# Patient Record
Sex: Female | Born: 1985 | Race: White | Hispanic: No | Marital: Married | Smoking: Former smoker
Health system: Southern US, Community
[De-identification: ages and names within clinical notes are randomized; demographics above are authoritative.]

## PROBLEM LIST (undated history)

## (undated) DIAGNOSIS — R002 Palpitations: Secondary | ICD-10-CM

## (undated) DIAGNOSIS — F419 Anxiety disorder, unspecified: Secondary | ICD-10-CM

## (undated) DIAGNOSIS — K029 Dental caries, unspecified: Secondary | ICD-10-CM

## (undated) DIAGNOSIS — R0602 Shortness of breath: Secondary | ICD-10-CM

## (undated) DIAGNOSIS — F32A Depression, unspecified: Secondary | ICD-10-CM

## (undated) DIAGNOSIS — R9431 Abnormal electrocardiogram [ECG] [EKG]: Secondary | ICD-10-CM

## (undated) DIAGNOSIS — J45909 Unspecified asthma, uncomplicated: Secondary | ICD-10-CM

## (undated) HISTORY — DX: Shortness of breath: R06.02

## (undated) HISTORY — DX: Palpitations: R00.2

## (undated) HISTORY — DX: Abnormal electrocardiogram (ECG) (EKG): R94.31

## (undated) HISTORY — DX: Depression, unspecified: F32.A

---
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
## 2010-06-06 ENCOUNTER — Ambulatory Visit (HOSPITAL_COMMUNITY): Admission: RE | Admit: 2010-06-06 | Discharge: 2010-06-06 | Payer: Self-pay | Admitting: Obstetrics and Gynecology

## 2010-07-12 ENCOUNTER — Ambulatory Visit (HOSPITAL_COMMUNITY): Admission: RE | Admit: 2010-07-12 | Discharge: 2010-07-12 | Payer: Self-pay | Admitting: Obstetrics and Gynecology

## 2010-07-19 ENCOUNTER — Ambulatory Visit (HOSPITAL_COMMUNITY)
Admission: RE | Admit: 2010-07-19 | Discharge: 2010-07-19 | Payer: Self-pay | Source: Home / Self Care | Admitting: Obstetrics & Gynecology

## 2010-07-31 ENCOUNTER — Inpatient Hospital Stay (HOSPITAL_COMMUNITY): Admission: AD | Admit: 2010-07-31 | Discharge: 2010-08-03 | Payer: Self-pay | Admitting: Obstetrics and Gynecology

## 2010-09-21 ENCOUNTER — Encounter: Payer: Self-pay | Admitting: Obstetrics & Gynecology

## 2010-11-11 LAB — CBC
Hemoglobin: 10.5 g/dL — ABNORMAL LOW (ref 12.0–15.0)
MCH: 32.6 pg (ref 26.0–34.0)
Platelets: 177 10*3/uL (ref 150–400)
RBC: 3.22 MIL/uL — ABNORMAL LOW (ref 3.87–5.11)
WBC: 10.2 10*3/uL (ref 4.0–10.5)

## 2010-11-12 LAB — TYPE AND SCREEN
ABO/RH(D): A POS
Antibody Screen: NEGATIVE

## 2010-11-12 LAB — CBC
HCT: 37.7 % (ref 36.0–46.0)
Hemoglobin: 12.8 g/dL (ref 12.0–15.0)
MCV: 94.9 fL (ref 78.0–100.0)
RBC: 3.97 MIL/uL (ref 3.87–5.11)
WBC: 11.1 10*3/uL — ABNORMAL HIGH (ref 4.0–10.5)

## 2010-11-12 LAB — ABO/RH: ABO/RH(D): A POS
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
## 2011-01-17 NOTE — Consult Note (Signed)
NAME:  Sandra Gilbert, Sandra Gilbert NO.:  [id number]   MEDICAL RECORD NO.:  [id number]          PATIENT TYPE:  MAT   LOCATION:  [facility]                          FACILITY:  [facility]   PHYSICIAN:  Richardean Sale, M.D.   DATE OF BIRTH:  1985/11/29   DATE OF CONSULTATION:  DATE OF DISCHARGE:                                   CONSULTATION   PROGRESS NOTE:   SUBJECTIVE:  This is a 25 year old gravida 0, white female who presented  originally from [facility] complaining of contractions at around 36-  weeks gestation. I evaluated the patient here in maternity admissions. She  told me that her due date was April 24, 2005 which would make her 36 plus  weeks. Upon admission she was contracting irregularly about every 3 to 6  minutes. I checked her cervix. She was about 1 cm, 70% effaced, -1 station  and vertex. The patient states she was seen at [facility] within the  last 48 hours and her cervix was also dilated 1 cm. Given that she stated  she was 36 plus weeks gestation, she received intravenous hydration with  plans to not tocolyse or augment labor. After I initially met with the  patient we acquired her medical records from [facility] [place] her prenatal care. Based on  an ultrasound that was performed at 13 weeks, the patient's due date is  May 11, 2005 which makes her 34 weeks and 1 day. According to the  patient's prenatal records, her due date has been May 11, 2005 since  the beginning of this prenatal record which is dated at her 28th week. Given  this finding, I had a talk with the patient and she admits that her due date  is May 11, 2005 which places her at 34 weeks and 1 day gestation. The  patient is still contracting after being hydrated for an hour. Repeat  cervical exam per the nurse is 1 cm, 70%, -1 station, vertex and unchanged  from previous exam.   OBJECTIVE:  VITAL SIGNS:  She  is afebrile. Her vital signs are stable. Fetal  heart rate tracing is reactive. Tocometer tracing shows contractions every 3  to 6 minutes.  ABDOMEN:  Gravid, soft and nontender.  CERVIX:  Is still 1 cm, 70%, -1 station, vertex.   Urinalysis is pending.   ASSESSMENT:  25 year old gravida 1, para 0 white female at 13 weeks and 1  day gestation by a 13-week ultrasound, placing her due date at May 11, 2005, who is having premature contractions. No cervical change.   PLAN:  Given that the patient is 34 weeks I recommend tocolysis. I explained  to the patient that the reason for this is to avoid delivering a premature  infant that may have respiratory difficulty or possibly mental delay. The  patient refused Terbutaline as she stated, It will hurt my baby.  I  explained to the patient that as a physician I would not administer a  medication that I thought would harm her baby. The  patient still refused  Terbutaline. She said she has taken both the oral and the injection form and  does not like the way it made her feel. I offered Procardia 10 mg p.o. x1.  The patient initially refused the medication, I again explained to her that  the reason for administering this medication is to prevent premature birth  which could have significant complications for her baby. Patient was given  the option of signing out against medical advice but agreed to take the  medication. We will treat with Procardia 10 mg p.o. x1 and reassess in 1  hour. If patient continues to contract will obtain group B beta strep  cultures, start antibiotics and will consider administering magnesium  sulfate.       JW/MEDQ  D:  03/30/2005  T:  03/30/2005  Job:  [id number]

## 2012-09-01 DIAGNOSIS — F419 Anxiety disorder, unspecified: Secondary | ICD-10-CM

## 2012-09-01 HISTORY — DX: Anxiety disorder, unspecified: F41.9

## 2014-11-01 ENCOUNTER — Inpatient Hospital Stay (HOSPITAL_COMMUNITY): Payer: Medicaid Other

## 2014-11-01 ENCOUNTER — Encounter (HOSPITAL_COMMUNITY): Payer: Self-pay

## 2014-11-01 ENCOUNTER — Inpatient Hospital Stay (HOSPITAL_COMMUNITY)
Admission: AD | Admit: 2014-11-01 | Discharge: 2014-11-01 | Disposition: A | Discharge status: Home / Self Care | Payer: Medicaid Other | Source: Ambulatory Visit | Attending: Obstetrics & Gynecology | Admitting: Obstetrics & Gynecology

## 2014-11-01 DIAGNOSIS — F17200 Nicotine dependence, unspecified, uncomplicated: Secondary | ICD-10-CM | POA: Diagnosis not present

## 2014-11-01 DIAGNOSIS — R109 Unspecified abdominal pain: Secondary | ICD-10-CM | POA: Diagnosis present

## 2014-11-01 DIAGNOSIS — Z3A10 10 weeks gestation of pregnancy: Secondary | ICD-10-CM | POA: Diagnosis not present

## 2014-11-01 DIAGNOSIS — O021 Missed abortion: Secondary | ICD-10-CM | POA: Diagnosis not present

## 2014-11-01 HISTORY — DX: Unspecified asthma, uncomplicated: J45.909

## 2014-11-01 LAB — URINALYSIS, ROUTINE W REFLEX MICROSCOPIC
Bilirubin Urine: NEGATIVE
GLUCOSE, UA: NEGATIVE mg/dL
Hgb urine dipstick: NEGATIVE
Ketones, ur: 15 mg/dL — AB
LEUKOCYTES UA: NEGATIVE
Nitrite: NEGATIVE
PROTEIN: NEGATIVE mg/dL
Specific Gravity, Urine: 1.015 (ref 1.005–1.030)
UROBILINOGEN UA: 1 mg/dL (ref 0.0–1.0)
pH: 6 (ref 5.0–8.0)

## 2014-11-01 LAB — CBC
HEMATOCRIT: 33.7 % — AB (ref 36.0–46.0)
HEMOGLOBIN: 11.8 g/dL — AB (ref 12.0–15.0)
MCH: 31.1 pg (ref 26.0–34.0)
MCHC: 35 g/dL (ref 30.0–36.0)
MCV: 88.9 fL (ref 78.0–100.0)
PLATELETS: 212 10*3/uL (ref 150–400)
RBC: 3.79 MIL/uL — AB (ref 3.87–5.11)
RDW: 12.5 % (ref 11.5–15.5)
WBC: 7.7 10*3/uL (ref 4.0–10.5)

## 2014-11-01 LAB — HCG, QUANTITATIVE, PREGNANCY: hCG, Beta Chain, Quant, S: 137886 m[IU]/mL — ABNORMAL HIGH (ref <5)

## 2014-11-01 MED ORDER — HYDROMORPHONE HCL 1 MG/ML IJ SOLN
1.0000 mg | Freq: Once | INTRAMUSCULAR | Status: AC
  Administered 2014-11-01: 1 mg via INTRAMUSCULAR
  Filled 2014-11-01: qty 1

## 2014-11-01 MED ORDER — PROMETHAZINE HCL 25 MG/ML IJ SOLN
12.5000 mg | Freq: Once | INTRAMUSCULAR | Status: AC
  Administered 2014-11-01: 12.5 mg via INTRAMUSCULAR
  Filled 2014-11-01: qty 1

## 2014-11-01 MED ORDER — HYDROCODONE-ACETAMINOPHEN 5-325 MG PO TABS: 1.0000 | ORAL_TABLET | ORAL | Status: DC | PRN

## 2014-11-01 NOTE — Progress Notes (Signed)
Unable to doppler FHTs 

## 2014-11-01 NOTE — MAU Note (Signed)
Pt presents to MAU with complaints of pain in the right lower side of her abdomen since this morning. Denies any vaginal bleeding or discharge

## 2014-11-01 NOTE — MAU Provider Note (Signed)
History     CSN: [id number]  Arrival date and time: 11/01/14 1344   First Provider Initiated Contact with Patient 11/01/14 1433      Chief Complaint  Patient presents with  . Abdominal Pain   HPI  [name] is a 29 y.o. female [phone] at 6372w1d who presents with right sided abdominal pain. She went to the health department in January and got her proof of pregnancy; she did not have an US done. She says the pain started this morning and has worsened throughout the day. She denies vaginal bleeding.    OB History    Gravida Para Term Preterm AB TAB SAB Ectopic Multiple Living   4 3 3       3       Past Medical History  Diagnosis Date  . Asthma     Past Surgical History  Procedure Laterality Date  . Cesarean section      History reviewed. No pertinent family history.  History  Substance Use Topics  . Smoking status: Current Every Day Smoker  . Smokeless tobacco: Not on file  . Alcohol Use: Not on file    Allergies: No Known Allergies  Prescriptions prior to admission  Medication Sig Dispense Refill Last Dose  . albuterol (PROVENTIL HFA;VENTOLIN HFA) 108 (90 BASE) MCG/ACT inhaler Inhale 2 puffs into the lungs every 4 (four) hours as needed for wheezing or shortness of breath.   10/30/2014  . Prenatal Vit-Fe Fumarate-FA (PRENATAL MULTIVITAMIN) TABS tablet Take 1 tablet by mouth daily at 12 noon.   10/31/2014 at Unknown time   Results for orders placed or performed during the hospital encounter of 11/01/14 (from the past 48 hour(s))  hCG, quantitative, pregnancy     Status: Abnormal   Collection Time: 11/01/14  2:52 PM  Result Value Ref Range   hCG, Beta Chain, Quant, S [id number] (H) <5 mIU/mL    Comment:          GEST. AGE      CONC.  (mIU/mL)   <=1 WEEK        5 - 50     2 WEEKS       50 - 500     3 WEEKS       100 - 10,000     4 WEEKS     1,000 - 30,000     5 WEEKS     3,500 - 115,000   6-8 WEEKS     12,000 - 270,000    12 WEEKS     15,000 - 220,000         FEMALE AND NON-PREGNANT FEMALE:     LESS THAN 5 mIU/mL   CBC     Status: Abnormal   Collection Time: 11/01/14  2:52 PM  Result Value Ref Range   WBC 7.7 4.0 - 10.5 K/uL   RBC 3.79 (L) 3.87 - 5.11 MIL/uL   Hemoglobin 11.8 (L) 12.0 - 15.0 g/dL   HCT 56.233.7 (L) 13.036.0 - 86.546.0 %   MCV 88.9 78.0 - 100.0 fL   MCH 31.1 26.0 - 34.0 pg   MCHC 35.0 30.0 - 36.0 g/dL   RDW 78.412.5 69.611.5 - 29.515.5 %   Platelets 212 150 - 400 K/uL   [place] Ob Comp Less 14 Wks  11/01/2014   CLINICAL DATA:  Abdominal pain in early pregnancy. Gestational age by LMP of fourteen weeks 2 days.  EXAM: OBSTETRIC <14 WK ULTRASOUND  TECHNIQUE: Transabdominal ultrasound was performed for  evaluation of the gestation as well as the maternal uterus and adnexal regions.  COMPARISON:  None.  FINDINGS: Intrauterine gestational sac: Visualized/ irregular in shape.  Yolk sac:  Visualized  Embryo:  Visualized  Cardiac Activity: Absent  CRL:   32 mm  10 w 1 d                  [place] EDC: 05/29/2015  Maternal uterus/adnexae: No mass or free fluid identified.  IMPRESSION: Failed IUP measuring 10 weeks 1 day. This follows SRU consensus guidelines: Diagnostic Criteria for Nonviable Pregnancy Early in the First Trimester. Macy Mis J Med [phone].   Electronically Signed   By: Myles Rosenthal M.D.   On: 11/01/2014 15:38      Review of Systems  Constitutional: Negative for fever and chills.  Gastrointestinal: Positive for abdominal pain. Negative for nausea, vomiting, diarrhea and constipation.   Physical Exam   Blood pressure 103/60, pulse 82, temperature 98.4 F (36.9 C), temperature source Oral, resp. rate 17, height  (1.549 m), weight 47.174 kg (104 lb).  Physical Exam  Constitutional: She is oriented to person, place, and time. She appears well-developed and well-nourished. No distress.  HENT:  Head: Normocephalic.  Eyes: Pupils are equal, round, and reactive to light.  Neck: Neck supple.  Respiratory: Effort normal.  GI: Soft. Normal  appearance. There is tenderness in the right lower quadrant and suprapubic area. There is no rigidity, no rebound and no guarding.  Musculoskeletal: Normal range of motion.  Neurological: She is alert and oriented to person, place, and time.  Skin: Skin is warm. She is not diaphoretic.  Psychiatric: Her behavior is normal.    MAU Course  Procedures  None  MDM  Unable to doppler fetal heart tones.  CBC Quant A positive blood type   D&E discussed with patient based on gestational age; patient and significant other agreeable   Assessment and Plan   A:  Missed AB  Abdominal pain   P:  Discharge home in stable condition Message sent to [facility] for D & E scheduling RX: vicodin if needed Return to MAU as needed, if symptoms worsen   Debbrah Alar, NP 11/01/2014 3:31 PM

## 2014-11-03 ENCOUNTER — Encounter (HOSPITAL_COMMUNITY): Payer: Self-pay | Admitting: Emergency Medicine

## 2014-11-03 ENCOUNTER — Encounter (HOSPITAL_COMMUNITY)
Admission: AD | Disposition: A | Discharge status: Home / Self Care | Payer: Self-pay | Source: Ambulatory Visit | Attending: Obstetrics & Gynecology

## 2014-11-03 ENCOUNTER — Ambulatory Visit (HOSPITAL_COMMUNITY): Payer: Medicaid Other | Admitting: Anesthesiology

## 2014-11-03 ENCOUNTER — Ambulatory Visit (HOSPITAL_COMMUNITY)
Admission: AD | Admit: 2014-11-03 | Discharge: 2014-11-03 | Disposition: A | Discharge status: Home / Self Care | Payer: Medicaid Other | Source: Ambulatory Visit | Attending: Obstetrics & Gynecology | Admitting: Obstetrics & Gynecology

## 2014-11-03 DIAGNOSIS — J45909 Unspecified asthma, uncomplicated: Secondary | ICD-10-CM | POA: Insufficient documentation

## 2014-11-03 DIAGNOSIS — F17210 Nicotine dependence, cigarettes, uncomplicated: Secondary | ICD-10-CM | POA: Diagnosis not present

## 2014-11-03 DIAGNOSIS — O021 Missed abortion: Secondary | ICD-10-CM

## 2014-11-03 DIAGNOSIS — Z3A10 10 weeks gestation of pregnancy: Secondary | ICD-10-CM | POA: Insufficient documentation

## 2014-11-03 DIAGNOSIS — F419 Anxiety disorder, unspecified: Secondary | ICD-10-CM | POA: Insufficient documentation

## 2014-11-03 HISTORY — DX: Anxiety disorder, unspecified: F41.9

## 2014-11-03 HISTORY — DX: Dental caries, unspecified: K02.9

## 2014-11-03 HISTORY — PX: DILATION AND EVACUATION: SHX1459

## 2014-11-03 HISTORY — DX: Missed abortion: O02.1

## 2014-11-03 SURGERY — DILATION AND EVACUATION, UTERUS
Anesthesia: Monitor Anesthesia Care

## 2014-11-03 MED ORDER — HYDROCODONE-ACETAMINOPHEN 5-325 MG PO TABS: 1.0000 | ORAL_TABLET | ORAL | Status: DC | PRN

## 2014-11-03 MED ORDER — LIDOCAINE HCL (CARDIAC) 20 MG/ML IV SOLN
INTRAVENOUS | Status: DC | PRN
  Administered 2014-11-03: 60 mg via INTRAVENOUS

## 2014-11-03 MED ORDER — SCOPOLAMINE 1 MG/3DAYS TD PT72
MEDICATED_PATCH | TRANSDERMAL | Status: AC
  Administered 2014-11-03: 1.5 mg via TRANSDERMAL
  Filled 2014-11-03: qty 1

## 2014-11-03 MED ORDER — LACTATED RINGERS IV SOLN: INTRAVENOUS | Status: DC

## 2014-11-03 MED ORDER — MIDAZOLAM HCL 2 MG/2ML IJ SOLN
INTRAMUSCULAR | Status: DC | PRN
  Administered 2014-11-03: 2 mg via INTRAVENOUS

## 2014-11-03 MED ORDER — DEXAMETHASONE SODIUM PHOSPHATE 4 MG/ML IJ SOLN
INTRAMUSCULAR | Status: DC | PRN
  Administered 2014-11-03: 4 mg via INTRAVENOUS

## 2014-11-03 MED ORDER — FENTANYL CITRATE 0.05 MG/ML IJ SOLN
INTRAMUSCULAR | Status: AC
  Filled 2014-11-03: qty 2

## 2014-11-03 MED ORDER — DOXYCYCLINE HYCLATE 100 MG IV SOLR
200.0000 mg | INTRAVENOUS | Status: AC
  Administered 2014-11-03: 200 mg via INTRAVENOUS
  Filled 2014-11-03: qty 200

## 2014-11-03 MED ORDER — GLYCOPYRROLATE 0.2 MG/ML IJ SOLN
INTRAMUSCULAR | Status: DC | PRN
  Administered 2014-11-03: 0.1 mg via INTRAVENOUS

## 2014-11-03 MED ORDER — SCOPOLAMINE 1 MG/3DAYS TD PT72
1.0000 | MEDICATED_PATCH | Freq: Once | TRANSDERMAL | Status: DC
  Administered 2014-11-03: 1.5 mg via TRANSDERMAL

## 2014-11-03 MED ORDER — PROPOFOL 10 MG/ML IV BOLUS
INTRAVENOUS | Status: AC
  Filled 2014-11-03: qty 40

## 2014-11-03 MED ORDER — LACTATED RINGERS IV SOLN
INTRAVENOUS | Status: DC
Start: 2014-11-03 — End: 2014-11-03
  Administered 2014-11-03: 14:00:00 via INTRAVENOUS

## 2014-11-03 MED ORDER — OXYCODONE HCL 5 MG PO TABS: 5.0000 mg | ORAL_TABLET | Freq: Once | ORAL | Status: DC | PRN

## 2014-11-03 MED ORDER — DEXAMETHASONE SODIUM PHOSPHATE 10 MG/ML IJ SOLN
INTRAMUSCULAR | Status: AC
  Filled 2014-11-03: qty 1

## 2014-11-03 MED ORDER — MIDAZOLAM HCL 2 MG/2ML IJ SOLN
INTRAMUSCULAR | Status: AC
  Filled 2014-11-03: qty 2

## 2014-11-03 MED ORDER — ONDANSETRON HCL 4 MG/2ML IJ SOLN
INTRAMUSCULAR | Status: DC | PRN
  Administered 2014-11-03: 4 mg via INTRAVENOUS

## 2014-11-03 MED ORDER — METOCLOPRAMIDE HCL 5 MG/ML IJ SOLN: 10.0000 mg | Freq: Once | INTRAMUSCULAR | Status: DC | PRN

## 2014-11-03 MED ORDER — BUPIVACAINE-EPINEPHRINE 0.5% -1:200000 IJ SOLN
INTRAMUSCULAR | Status: DC | PRN
  Administered 2014-11-03: 30 mL

## 2014-11-03 MED ORDER — LIDOCAINE HCL (CARDIAC) 20 MG/ML IV SOLN
INTRAVENOUS | Status: AC
  Filled 2014-11-03: qty 5

## 2014-11-03 MED ORDER — 0.9 % SODIUM CHLORIDE (POUR BTL) OPTIME
TOPICAL | Status: DC | PRN
  Administered 2014-11-03: 1000 mL

## 2014-11-03 MED ORDER — PROPOFOL 10 MG/ML IV BOLUS
INTRAVENOUS | Status: DC | PRN
  Administered 2014-11-03: 10 mg via INTRAVENOUS
  Administered 2014-11-03: 20 mg via INTRAVENOUS

## 2014-11-03 MED ORDER — OXYCODONE HCL 5 MG/5ML PO SOLN: 5.0000 mg | Freq: Once | ORAL | Status: DC | PRN

## 2014-11-03 MED ORDER — ONDANSETRON HCL 4 MG/2ML IJ SOLN
INTRAMUSCULAR | Status: AC
  Filled 2014-11-03: qty 2

## 2014-11-03 MED ORDER — MEPERIDINE HCL 25 MG/ML IJ SOLN: 6.2500 mg | INTRAMUSCULAR | Status: DC | PRN

## 2014-11-03 MED ORDER — PROPOFOL INFUSION 10 MG/ML OPTIME
INTRAVENOUS | Status: DC | PRN
  Administered 2014-11-03: 100 ug/kg/min via INTRAVENOUS

## 2014-11-03 MED ORDER — FENTANYL CITRATE 0.05 MG/ML IJ SOLN
INTRAMUSCULAR | Status: DC | PRN
  Administered 2014-11-03 (×2): 50 ug via INTRAVENOUS
  Administered 2014-11-03: 100 ug via INTRAVENOUS

## 2014-11-03 MED ORDER — FENTANYL CITRATE 0.05 MG/ML IJ SOLN
25.0000 ug | INTRAMUSCULAR | Status: DC | PRN
  Administered 2014-11-03 (×2): 50 ug via INTRAVENOUS

## 2014-11-03 SURGICAL SUPPLY — 18 items
CATH ROBINSON RED A/P 16FR (CATHETERS) ×2 IMPLANT
CLOTH BEACON ORANGE TIMEOUT ST (SAFETY) ×2 IMPLANT
DECANTER SPIKE VIAL GLASS SM (MISCELLANEOUS) ×1 IMPLANT
GLOVE BIO SURGEON STRL SZ 6.5 (GLOVE) ×2 IMPLANT
GLOVE BIOGEL PI IND STRL 7.0 (GLOVE) ×1 IMPLANT
GLOVE BIOGEL PI INDICATOR 7.0 (GLOVE) ×1
GOWN STRL REUS W/TWL LRG LVL3 (GOWN DISPOSABLE) ×5 IMPLANT
KIT BERKELEY 1ST TRIMESTER 3/8 (MISCELLANEOUS) ×1 IMPLANT
NS IRRIG 1000ML POUR BTL (IV SOLUTION) ×2 IMPLANT
PACK VAGINAL MINOR WOMEN LF (CUSTOM PROCEDURE TRAY) ×2 IMPLANT
PAD OB MATERNITY 4.3X12.25 (PERSONAL CARE ITEMS) ×2 IMPLANT
PAD PREP 24X48 CUFFED NSTRL (MISCELLANEOUS) ×2 IMPLANT
SET BERKELEY SUCTION TUBING (SUCTIONS) ×1 IMPLANT
TOWEL OR 17X24 6PK STRL BLUE (TOWEL DISPOSABLE) ×4 IMPLANT
VACURETTE 10 RIGID CVD (CANNULA) ×1 IMPLANT
VACURETTE 7MM CVD STRL WRAP (CANNULA) IMPLANT
VACURETTE 8 RIGID CVD (CANNULA) IMPLANT
VACURETTE 9 RIGID CVD (CANNULA) IMPLANT

## 2014-11-03 NOTE — Op Note (Signed)
Procedure: Suction dilation and curettage Preoperative diagnosis: Missed abortion at [redacted] weeks gestation Postoperative diagnosis: Same Surgeon: Dr. Scheryl DarterJames Muriah Harsha Asst.: Dr.Kristy Loreta AveAcosta Anesthesia: Mac  Blood loss: Negligible Specimen: Products of conception Complications: None Drains: None Counts: Correct    Patient gave written consent for suction dilation and curettage at [redacted] weeks gestation with fetal demise. Patient identification was confirmed and she was brought to the operating room and inhalation anesthesia was induced. She's placed in dorsal lithotomy position. Perineum and vagina were sterilely prepped and draped. Bladder was drained with red rubber catheter. Speculum was inserted and cervix was visualized. Quarter percent Marcaine with 1-200,000 epinephrine was infiltrated. Cervix was dilated sufficiently to pass a 10 mm suction curette. Suction curettage was obtained until complete evacuation of the uterine cavity was assured. Products of conception were sent to pathology. Patient received IV Pitocin during the procedure and there was minimal bleeding. All instruments were removed. She tolerated the procedure well and was brought in stable condition to PACU.  Dr. Scheryl DarterJames Husna Krone 11/03/2014 4:38 PM

## 2014-11-03 NOTE — H&P (Signed)
Sandra Gilbert is a 29 y.o. female [phone] at 667w1d who presents with right sided abdominal pain. She went to the health department in January and got her proof of pregnancy; she did not have an US done. She says the pain started this morning and has worsened throughout the day. She denies vaginal bleeding. Missed abortion diagnosed and scheduled for dilation and curettage today   OB History    Gravida Para Term Preterm AB TAB SAB Ectopic Multiple Living   4 3 3       3       Past Medical History  Diagnosis Date  . Asthma     Past Surgical History  Procedure Laterality Date  . Cesarean section      History reviewed. No pertinent family history.  History  Substance Use Topics  . Smoking status: Current Every Day Smoker  . Smokeless tobacco: Not on file  . Alcohol Use: Not on file    Allergies: No Known Allergies  Prescriptions prior to admission  Medication Sig Dispense Refill Last Dose  . albuterol (PROVENTIL HFA;VENTOLIN HFA) 108 (90 BASE) MCG/ACT inhaler Inhale 2 puffs into the lungs every 4 (four) hours as needed for wheezing or shortness of breath.   10/30/2014  . Prenatal Vit-Fe Fumarate-FA (PRENATAL MULTIVITAMIN) TABS tablet Take 1 tablet by mouth daily at 12 noon.   10/31/2014 at Unknown time    Lab Results Last 48 Hours    Results for orders placed or performed during the hospital encounter of 11/01/14 (from the past 48 hour(s))  hCG, quantitative, pregnancy Status: Abnormal   Collection Time: 11/01/14 2:52 PM  Result Value Ref Range   hCG, Beta Chain, Quant, S [id number] (H) <5 mIU/mL    Comment:    GEST. AGE CONC. (mIU/mL)  <=1 WEEK 5 - 50  2 WEEKS 50 - 500  3 WEEKS 100 - 10,000  4 WEEKS 1,000 - 30,000  5 WEEKS 3,500 - 115,000  6-8 WEEKS 12,000 - 270,000  12 WEEKS 15,000 - 220,000   FEMALE AND  NON-PREGNANT FEMALE:  LESS THAN 5 mIU/mL   CBC Status: Abnormal   Collection Time: 11/01/14 2:52 PM  Result Value Ref Range   WBC 7.7 4.0 - 10.5 K/uL   RBC 3.79 (L) 3.87 - 5.11 MIL/uL   Hemoglobin 11.8 (L) 12.0 - 15.0 g/dL   HCT 30.833.7 (L) 65.736.0 - 84.646.0 %   MCV 88.9 78.0 - 100.0 fL   MCH 31.1 26.0 - 34.0 pg   MCHC 35.0 30.0 - 36.0 g/dL   RDW 96.212.5 95.211.5 - 84.115.5 %   Platelets 212 150 - 400 K/uL      Imaging Results (Last 48 hours)    [place] Ob Comp Less 14 Wks  11/01/2014 CLINICAL DATA: Abdominal pain in early pregnancy. Gestational age by LMP of fourteen weeks 2 days. EXAM: OBSTETRIC <14 WK ULTRASOUND TECHNIQUE: Transabdominal ultrasound was performed for evaluation of the gestation as well as the maternal uterus and adnexal regions. COMPARISON: None. FINDINGS: Intrauterine gestational sac: Visualized/ irregular in shape. Yolk sac: Visualized Embryo: Visualized Cardiac Activity: Absent CRL: 32 mm 10 w 1 d  US EDC: 05/29/2015 Maternal uterus/adnexae: No mass or free fluid identified. IMPRESSION: Failed IUP measuring 10 weeks 1 day. This follows SRU consensus guidelines: Diagnostic Criteria for Nonviable Pregnancy Early in the First Trimester. Macy Mis Engl J Med [phone]. Electronically Signed By: Myles RosenthalJohn Stahl M.D. On: 11/01/2014 15:38       Review of Systems  Constitutional: Negative for  fever and chills.  Gastrointestinal: Positive for abdominal pain. Negative for nausea, vomiting, diarrhea and constipation.   Physical Exam   Blood pressure 103/60, pulse 82, temperature 98.4 F (36.9 C), temperature source Oral, resp. rate 17, height  (1.549 m), weight 47.174 kg (104 lb).  Physical Exam  Constitutional: She is oriented to person, place, and time. She appears well-developed and well-nourished. No distress.  HENT:  Head: Normocephalic.  Eyes: Pupils are equal, round, and reactive to light.   Neck: Neck supple.  Respiratory: Effort normal.  GI: Soft. Normal appearance. There is tenderness in the right lower quadrant and suprapubic area. There is no rigidity, no rebound and no guarding.  Musculoskeletal: Normal range of motion.  Neurological: She is alert and oriented to person, place, and time.  Skin: Skin is warm. She is not diaphoretic.  Psychiatric: Her behavior is normal.    MAU Course  Procedures  None  MDM  Unable to doppler fetal heart tones.  CBC Quant A positive blood type  CLINICAL DATA: Abdominal pain in early pregnancy. Gestational age by LMP of fourteen weeks 2 days.  EXAM: OBSTETRIC <14 WK ULTRASOUND  TECHNIQUE: Transabdominal ultrasound was performed for evaluation of the gestation as well as the maternal uterus and adnexal regions.  COMPARISON: None.  FINDINGS: Intrauterine gestational sac: Visualized/ irregular in shape.  Yolk sac: Visualized  Embryo: Visualized  Cardiac Activity: Absent  CRL: 32 mm 10 w 1 d [place] EDC: 05/29/2015  Maternal uterus/adnexae: No mass or free fluid identified.  IMPRESSION: Failed IUP measuring 10 weeks 1 day. This follows SRU consensus guidelines: Diagnostic Criteria for Nonviable Pregnancy Early in the First Trimester. Macy Mis J Med [phone].   Electronically Signed  By: Myles Rosenthal M.D.  On: 11/01/2014 15:38 D&E discussed with patient based on gestational age; patient and significant other agreeable   Assessment and Plan   A:  Missed AB  Abdominal pain   P:    Suction dilation and curettage scheduled today, The procedure and the risk of anesthesia, bleeding, infection, bowel and bladder injury and uterine perforation were discussed and her questions were answered. The procedure will be scheduled as an outpatient.   Adam Phenix, MD 11/03/2014 2:53 PM

## 2014-11-03 NOTE — Anesthesia Preprocedure Evaluation (Signed)
Anesthesia Evaluation  Patient identified by MRN, date of birth, ID band Patient awake    Reviewed: Allergy & Precautions, NPO status , Patient's Chart, lab work & pertinent test results  Airway Mallampati: II  TM Distance: >3 FB Neck ROM: Full    Dental  (+) Poor Dentition   Pulmonary asthma , Current Smoker,  breath sounds clear to auscultation  Pulmonary exam normal       Cardiovascular negative cardio ROS  Rhythm:Regular Rate:Normal     Neuro/Psych Anxiety negative neurological ROS     GI/Hepatic negative GI ROS, Neg liver ROS,   Endo/Other  negative endocrine ROS  Renal/GU negative Renal ROS  negative genitourinary   Musculoskeletal   Abdominal   Peds  Hematology   Anesthesia Other Findings   Reproductive/Obstetrics (+) Pregnancy Missed Ab- 10 weeks                             Anesthesia Physical Anesthesia Plan  ASA: II  Anesthesia Plan: MAC   Post-op Pain Management:    Induction: Intravenous  Airway Management Planned: Natural Airway  Additional Equipment:   Intra-op Plan:   Post-operative Plan:   Informed Consent: I have reviewed the patients History and Physical, chart, labs and discussed the procedure including the risks, benefits and alternatives for the proposed anesthesia with the patient or authorized representative who has indicated his/her understanding and acceptance.     Plan Discussed with: Anesthesiologist, CRNA and Surgeon  Anesthesia Plan Comments:         Anesthesia Quick Evaluation

## 2014-11-03 NOTE — Anesthesia Postprocedure Evaluation (Signed)
  Anesthesia Post-op Note  Patient: Sandra Gilbert  Procedure(s) Performed: Procedure(s): DILATATION AND EVACUATION (N/A)  Patient Location: PACU  Anesthesia Type:MAC  Level of Consciousness: awake, alert  and oriented  Airway and Oxygen Therapy: Patient Spontanous Breathing  Post-op Pain: none  Post-op Assessment: Post-op Vital signs reviewed, Patient's Cardiovascular Status Stable, Respiratory Function Stable, Patent Airway, No signs of Nausea or vomiting and Pain level controlled  Post-op Vital Signs: Reviewed and stable  Last Vitals:  Filed Vitals:   11/03/14 1615  BP: 98/66  Pulse: 75  Temp:   Resp: 13    Complications: No apparent anesthesia complications

## 2014-11-03 NOTE — Discharge Instructions (Addendum)
Dilation and Curettage or Vacuum Curettage, Care After  Refer to this sheet in the next few weeks. These instructions provide you with information on caring for yourself after your procedure. Your health care provider may also give you more specific instructions. Your treatment has been planned according to current medical practices, but problems sometimes occur. Call your health care provider if you have any problems or questions after your procedure.  WHAT TO EXPECT AFTER THE PROCEDURE  After your procedure, it is typical to have light cramping and bleeding. This may last for 2 days to 2 weeks after the procedure.  HOME CARE INSTRUCTIONS   · Do not drive for 24 hours.  · Wait 1 week before returning to strenuous activities.  · Take your temperature 2 times a day for 4 days and write it down. Provide these temperatures to your health care provider if you develop a fever.  · Avoid long periods of standing.  · Avoid heavy lifting, pushing, or pulling. Do not lift anything heavier than 10 pounds (4.5 kg).  · Limit stair climbing to once or twice a day.  · Take rest periods often.  · You may resume your usual diet.  · Drink enough fluids to keep your urine clear or pale yellow.  · Your usual bowel function should return. If you have constipation, you may:  ¨ Take a mild laxative with permission from your health care provider.  ¨ Add fruit and bran to your diet.  ¨ Drink more fluids.  · Take showers instead of baths until your health care provider gives you permission to take baths.  · Do not go swimming or use a hot tub until your health care provider approves.  · Try to have someone with you or available to you the first 24-48 hours, especially if you were given a general anesthetic.  · Do not douche, use tampons, or have sex (intercourse) for 2 weeks after the procedure.  · Only take over-the-counter or prescription medicines as directed by your health care provider. Do not take aspirin. It can cause  bleeding.  · Follow up with your health care provider as directed.  SEEK MEDICAL CARE IF:   · You have increasing cramps or pain that is not relieved with medicine.  · You have abdominal pain that does not seem to be related to the same area of earlier cramping and pain.  · You have bad smelling vaginal discharge.  · You have a rash.  · You are having problems with any medicine.  SEEK IMMEDIATE MEDICAL CARE IF:   · You have bleeding that is heavier than a normal menstrual period.  · You have a fever.  · You have chest pain.  · You have shortness of breath.  · You feel dizzy or feel like fainting.  · You pass out.  · You have pain in your shoulder strap area.  · You have heavy vaginal bleeding with or without blood clots.  MAKE SURE YOU:   · Understand these instructions.  · Will watch your condition.  · Will get help right away if you are not doing well or get worse.  Document Released: 08/15/2000 Document Revised: 08/23/2013 Document Reviewed: 03/17/2013  ExitCare® Patient Information ©2015 [employer]. This information is not intended to replace advice given to you by your health care provider. Make sure you discuss any questions you have with your health care provider.

## 2014-11-03 NOTE — Transfer of Care (Signed)
Immediate Anesthesia Transfer of Care Note  Patient: Sandra Gilbert  Procedure(s) Performed: Procedure(s): DILATATION AND EVACUATION (N/A)  Patient Location: PACU  Anesthesia Type:MAC  Level of Consciousness: awake, alert  and oriented  Airway & Oxygen Therapy: Patient Spontanous Breathing and Patient connected to nasal cannula oxygen  Post-op Assessment: Report given to RN and Post -op Vital signs reviewed and stable  Post vital signs: Reviewed and stable  Last Vitals:  Filed Vitals:   11/03/14 1331  BP: 102/61  Pulse: 94  Temp: 36.7 C  Resp: 16    Complications: No apparent anesthesia complications

## 2014-11-06 ENCOUNTER — Encounter (HOSPITAL_COMMUNITY): Payer: Self-pay | Admitting: Obstetrics & Gynecology

## 2014-11-06 ENCOUNTER — Telehealth: Payer: Self-pay | Admitting: *Deleted

## 2014-11-06 NOTE — Telephone Encounter (Signed)
Pt had d&e a few days ago and had a few questions. She asked if feeling bloated was normal, i advised that this could be normal following her procedure. Her next question is should she still be cramping, I advised that is a normal side effect of surgery. Patients final question was that her uterus feels firm, I told her this was part of the recovery process. Advised patient that if she develops severe pain, heavy bleeding, foul discharge or fever to come to MAU for evaluation. Patient voiced understanding and had no further questions.

## 2014-11-23 ENCOUNTER — Encounter: Payer: Self-pay | Admitting: Obstetrics & Gynecology

## 2014-11-23 ENCOUNTER — Ambulatory Visit (INDEPENDENT_AMBULATORY_CARE_PROVIDER_SITE_OTHER): Payer: Medicaid Other | Admitting: Obstetrics & Gynecology

## 2014-11-23 VITALS — BP 100/62 | HR 91 | Ht 63.5 in | Wt 104.2 lb

## 2014-11-23 DIAGNOSIS — O021 Missed abortion: Secondary | ICD-10-CM

## 2014-11-23 LAB — POCT URINALYSIS DIP (DEVICE)
Bilirubin Urine: NEGATIVE
Glucose, UA: NEGATIVE mg/dL
Ketones, ur: NEGATIVE mg/dL
Leukocytes, UA: NEGATIVE
Nitrite: NEGATIVE
PH: 6.5 (ref 5.0–8.0)
PROTEIN: NEGATIVE mg/dL
SPECIFIC GRAVITY, URINE: 1.01 (ref 1.005–1.030)
UROBILINOGEN UA: 0.2 mg/dL (ref 0.0–1.0)

## 2014-11-23 MED ORDER — NORETHIN ACE-ETH ESTRAD-FE 1-20 MG-MCG(24) PO TABS: 1.0000 | ORAL_TABLET | Freq: Every day | ORAL | Status: DC

## 2014-11-23 NOTE — Progress Notes (Signed)
Subjective:s/p Sab and D&C     Sandra Gilbert is a 29 y.o. female who presents to the clinic 2 weeks status post suction D&C for incomplete abortion. Eating a regular diet without difficulty. Bowel movements are normal. Pain is controlled with current analgesics. Medications being used: ibuprofen (OTC).  The following portions of the patient's history were reviewed and updated as appropriate: allergies, current medications, past family history, past medical history, past social history, past surgical history and problem list.  Review of Systems Pertinent items are noted in HPI.    Objective:    BP 100/62 mmHg  Pulse 91  Ht 5' 3.5" (1.613 m)  Wt 104 lb 3.2 oz (47.265 kg)  BMI 18.17 kg/m2  Breastfeeding? Unknown General:  alert, cooperative and no distress  Abdomen: soft, bowel sounds active, non-tender  Incision:   n/a     Assessment:    Postoperative course complicated by still having pain and spotting Operative findings again reviewed. Pathology report discussed.    Plan:    1. Continue any current medications. 2. Wound care discussed. 3. Activity restrictions: none 4. Anticipated return to work: now. 5. Follow up: prn   Adam PhenixJames G Krysta Bloomfield, MD 11/27/2014

## 2014-11-23 NOTE — Patient Instructions (Signed)
Incomplete Miscarriage A miscarriage is the sudden loss of an unborn baby (fetus) before the 20th week of pregnancy. In an incomplete miscarriage, parts of the fetus or placenta (afterbirth) remain in the body.  Having a miscarriage can be an emotional experience. Talk with your health care provider about any questions you may have about miscarrying, the grieving process, and your future pregnancy plans. CAUSES   Problems with the fetal chromosomes that make it impossible for the baby to develop normally. Problems with the baby's genes or chromosomes are most often the result of errors that occur by chance as the embryo divides and grows. The problems are not inherited from the parents.  Infection of the cervix or uterus.  Hormone problems.  Problems with the cervix, such as having an incompetent cervix. This is when the tissue in the cervix is not strong enough to hold the pregnancy.  Problems with the uterus, such as an abnormally shaped uterus, uterine fibroids, or congenital abnormalities.  Certain medical conditions.  Smoking, drinking alcohol, or taking illegal drugs.  Trauma. SYMPTOMS   Vaginal bleeding or spotting, with or without cramps or pain.  Pain or cramping in the abdomen or lower back.  Passing fluid, tissue, or blood clots from the vagina. DIAGNOSIS  Your health care provider will perform a physical exam. You may also have an ultrasound to confirm the miscarriage. Blood or urine tests may also be ordered. TREATMENT   Usually, a dilation and curettage (D&C) procedure is performed. During a D&C procedure, the cervix is widened (dilated) and any remaining fetal or placental tissue is gently removed from the uterus.  Antibiotic medicines are prescribed if there is an infection. Other medicines may be given to reduce the size of the uterus (contract) if there is a lot of bleeding.  If you have Rh negative blood and your baby was Rh positive, you will need a Rho (D)  immune globulin shot. This shot will protect any future baby from having Rh blood problems in future pregnancies.  You may be confined to bed rest. This means you should stay in bed and only get up to use the bathroom. HOME CARE INSTRUCTIONS   Rest as directed by your health care provider.  Restrict activity as directed by your health care provider. You may be allowed to continue light activity if curettage was not done but you require further treatment.  Keep track of the number of pads you use each day. Keep track of how soaked (saturated) they are. Record this information.  Do not  use tampons.  Do not douche or have sexual intercourse until approved by your health care provider.  Keep all follow-up appointments for reevaluation and continuing management.  Only take over-the-counter or prescription medicines for pain, fever, or discomfort as directed by your health care provider.  Take antibiotic medicine as directed by your health care provider. Make sure you finish it even if you start to feel better. SEEK IMMEDIATE MEDICAL CARE IF:   You experience severe cramps in your stomach, back, or abdomen.  You have an unexplained temperature (make sure to record these temperatures).  You pass large clots or tissue (save these for your health care provider to inspect).  Your bleeding increases.  You become light-headed, weak, or have fainting episodes. MAKE SURE YOU:   Understand these instructions.  Will watch your condition.  Will get help right away if you are not doing well or get worse. Document Released: 08/18/2005 Document Revised: 01/02/2014 Document Reviewed:   03/17/2013 ExitCare Patient Information 2015 [employer]. This information is not intended to replace advice given to you by your health care provider. Make sure you discuss any questions you have with your health care provider.  

## 2016-08-26 ENCOUNTER — Inpatient Hospital Stay (EMERGENCY_DEPARTMENT_HOSPITAL)
Admission: AD | Admit: 2016-08-26 | Discharge: 2016-08-26 | Disposition: A | Discharge status: Home / Self Care | Payer: Medicaid Other | Source: Ambulatory Visit | Attending: Family Medicine | Admitting: Family Medicine

## 2016-08-26 ENCOUNTER — Encounter (HOSPITAL_COMMUNITY): Payer: Self-pay | Admitting: *Deleted

## 2016-08-26 ENCOUNTER — Other Ambulatory Visit (HOSPITAL_COMMUNITY): Payer: Self-pay | Admitting: Radiology

## 2016-08-26 ENCOUNTER — Inpatient Hospital Stay (HOSPITAL_COMMUNITY)
Admission: AD | Admit: 2016-08-26 | Discharge: 2016-08-27 | Disposition: A | Discharge status: Home / Self Care | Payer: Medicaid Other | Source: Ambulatory Visit | Attending: Family Medicine | Admitting: Family Medicine

## 2016-08-26 ENCOUNTER — Inpatient Hospital Stay (HOSPITAL_COMMUNITY): Payer: Medicaid Other

## 2016-08-26 DIAGNOSIS — J45909 Unspecified asthma, uncomplicated: Secondary | ICD-10-CM | POA: Insufficient documentation

## 2016-08-26 DIAGNOSIS — O9989 Other specified diseases and conditions complicating pregnancy, childbirth and the puerperium: Secondary | ICD-10-CM

## 2016-08-26 DIAGNOSIS — O039 Complete or unspecified spontaneous abortion without complication: Secondary | ICD-10-CM | POA: Insufficient documentation

## 2016-08-26 DIAGNOSIS — O209 Hemorrhage in early pregnancy, unspecified: Secondary | ICD-10-CM | POA: Diagnosis present

## 2016-08-26 DIAGNOSIS — F4310 Post-traumatic stress disorder, unspecified: Secondary | ICD-10-CM | POA: Diagnosis not present

## 2016-08-26 DIAGNOSIS — O4690 Antepartum hemorrhage, unspecified, unspecified trimester: Secondary | ICD-10-CM

## 2016-08-26 DIAGNOSIS — O99331 Smoking (tobacco) complicating pregnancy, first trimester: Secondary | ICD-10-CM | POA: Insufficient documentation

## 2016-08-26 DIAGNOSIS — Z6790 Unspecified blood type, Rh positive: Secondary | ICD-10-CM | POA: Diagnosis not present

## 2016-08-26 DIAGNOSIS — O99341 Other mental disorders complicating pregnancy, first trimester: Secondary | ICD-10-CM | POA: Diagnosis not present

## 2016-08-26 DIAGNOSIS — Z3A01 Less than 8 weeks gestation of pregnancy: Secondary | ICD-10-CM | POA: Insufficient documentation

## 2016-08-26 DIAGNOSIS — O468X1 Other antepartum hemorrhage, first trimester: Secondary | ICD-10-CM | POA: Diagnosis not present

## 2016-08-26 DIAGNOSIS — O99511 Diseases of the respiratory system complicating pregnancy, first trimester: Secondary | ICD-10-CM | POA: Diagnosis not present

## 2016-08-26 DIAGNOSIS — N854 Malposition of uterus: Secondary | ICD-10-CM | POA: Insufficient documentation

## 2016-08-26 DIAGNOSIS — O208 Other hemorrhage in early pregnancy: Secondary | ICD-10-CM

## 2016-08-26 DIAGNOSIS — O418X10 Other specified disorders of amniotic fluid and membranes, first trimester, not applicable or unspecified: Secondary | ICD-10-CM

## 2016-08-26 LAB — URINALYSIS, ROUTINE W REFLEX MICROSCOPIC
BILIRUBIN URINE: NEGATIVE
Glucose, UA: NEGATIVE mg/dL
Ketones, ur: NEGATIVE mg/dL
Leukocytes, UA: NEGATIVE
NITRITE: NEGATIVE
PH: 6 (ref 5.0–8.0)
Protein, ur: NEGATIVE mg/dL
SPECIFIC GRAVITY, URINE: 1.006 (ref 1.005–1.030)

## 2016-08-26 LAB — WET PREP, GENITAL
Clue Cells Wet Prep HPF POC: NONE SEEN
SPERM: NONE SEEN
Trich, Wet Prep: NONE SEEN
WBC WET PREP: NONE SEEN
Yeast Wet Prep HPF POC: NONE SEEN

## 2016-08-26 LAB — POCT PREGNANCY, URINE: Preg Test, Ur: POSITIVE — AB

## 2016-08-26 NOTE — MAU Provider Note (Signed)
History     CSN: [id number]  Arrival date and time: 08/26/16 1519   First Provider Initiated Contact with Patient 08/26/16 1626      Chief Complaint  Patient presents with  . Vaginal Bleeding   HPI Sandra Gilbert is a 30 y.o. [id number] at 4538w2d who presents with vaginal bleeding. Bleeding started 2 days ago as brown discharge. [facility] that day; had an ultrasound & was told that the "baby had a heartbeat". Since then describes bright red blood that she sees on toilet paper everytime she wipes. Mild intermittnet lower abdominal cramping. Rates pain 1/10. Has not treated. Denies n/v/d, constipation, dysuria, vaginal discharge, or recent intercourse.   OB History    Gravida Para Term Preterm AB Living   5 3 3  0 1 3   SAB TAB Ectopic Multiple Live Births   1 0 0 0 3      Past Medical History:  Diagnosis Date  . Anxiety 2014   PTSD  . Asthma   . Dental cavities    very poor dental hygeine  . Vaginal delivery 2006, 2011    Past Surgical History:  Procedure Laterality Date  . CESAREAN SECTION    . DILATION AND EVACUATION N/A 11/03/2014   Procedure: DILATATION AND EVACUATION;  Surgeon: Adam PhenixJames G Arnold, MD;  Location: [facility];  Service: Gynecology;  Laterality: N/A;    History reviewed. No pertinent family history.  Social History  Substance Use Topics  . Smoking status: Current Every Day Smoker    Packs/day: 0.50    Years: 15.00    Types: Cigarettes  . Smokeless tobacco: Never Used  . Alcohol use No    Allergies:  Allergies  Allergen Reactions  . Aspirin Nausea And Vomiting    Prescriptions Prior to Admission  Medication Sig Dispense Refill Last Dose  . Prenatal Vit-Fe Fumarate-FA (PRENATAL MULTIVITAMIN) TABS tablet Take 1 tablet by mouth daily at 12 noon.   08/25/2016 at Unknown time  . HYDROcodone-acetaminophen (NORCO/VICODIN) 5-325 MG per tablet Take 1-2 tablets by mouth every 4 (four) hours as needed. (Patient not taking: Reported on  08/26/2016) 15 tablet 0 Not Taking at Unknown time  . Norethindrone Acetate-Ethinyl Estrad-FE (LOESTRIN 24 FE) 1-20 MG-MCG(24) tablet Take 1 tablet by mouth daily. (Patient not taking: Reported on 08/26/2016) 1 Package 11 Not Taking at Unknown time  . simethicone (MYLICON) 125 MG chewable tablet Chew 125 mg by mouth every 6 (six) hours as needed for flatulence.   Taking    Review of Systems  Constitutional: Negative.   Gastrointestinal: Positive for abdominal pain. Negative for constipation, diarrhea, nausea and vomiting.  Genitourinary: Negative for dysuria.       + vaginal bleeding   Physical Exam   Blood pressure 103/60, pulse 93, temperature 98 F (36.7 C), temperature source Oral, resp. rate 18, height 5\' 2"  (1.575 m), weight 103 lb (46.7 kg), last menstrual period 07/07/2016, unknown if currently breastfeeding.  Physical Exam  Nursing note and vitals reviewed. Constitutional: She is oriented to person, place, and time. She appears well-developed and well-nourished. No distress.  HENT:  Head: Normocephalic and atraumatic.  Eyes: Conjunctivae are normal. Right eye exhibits no discharge. Left eye exhibits no discharge. No scleral icterus.  Neck: Normal range of motion.  Cardiovascular: Normal rate.   Respiratory: Effort normal. No respiratory distress.  GI: Soft. There is no tenderness.  Genitourinary:  Genitourinary Comments: Small amount of dark red blood  Neurological: She is alert and  oriented to person, place, and time.  Skin: Skin is warm and dry. She is not diaphoretic.  Psychiatric: She has a normal mood and affect. Her behavior is normal. Judgment and thought content normal.    MAU Course  Procedures Results for orders placed or performed during the hospital encounter of 08/26/16 (from the past 24 hour(s))  Urinalysis, Routine w reflex microscopic     Status: Abnormal   Collection Time: 08/26/16  3:59 PM  Result Value Ref Range   Color, Urine YELLOW YELLOW    APPearance CLEAR CLEAR   Specific Gravity, Urine 1.006 1.005 - 1.030   pH 6.0 5.0 - 8.0   Glucose, UA NEGATIVE NEGATIVE mg/dL   Hgb urine dipstick LARGE (A) NEGATIVE   Bilirubin Urine NEGATIVE NEGATIVE   Ketones, ur NEGATIVE NEGATIVE mg/dL   Protein, ur NEGATIVE NEGATIVE mg/dL   Nitrite NEGATIVE NEGATIVE   Leukocytes, UA NEGATIVE NEGATIVE   RBC / HPF 0-5 0 - 5 RBC/hpf   WBC, UA 0-5 0 - 5 WBC/hpf   Bacteria, UA RARE (A) NONE SEEN   Squamous Epithelial / LPF 0-5 (A) NONE SEEN  Pregnancy, urine POC     Status: Abnormal   Collection Time: 08/26/16  4:28 PM  Result Value Ref Range   Preg Test, Ur POSITIVE (A) NEGATIVE  Wet prep, genital     Status: None   Collection Time: 08/26/16  4:50 PM  Result Value Ref Range   Yeast Wet Prep HPF POC NONE SEEN NONE SEEN   Trich, Wet Prep NONE SEEN NONE SEEN   Clue Cells Wet Prep HPF POC NONE SEEN NONE SEEN   WBC, Wet Prep HPF POC NONE SEEN NONE SEEN   Sperm NONE SEEN    US Ob Comp Less 14 Wks  Result Date: 08/26/2016 CLINICAL DATA:  30 year old with pregnant female with vaginal bleeding in the first trimester. EXAM: OBSTETRIC <14 WK [place] AND TRANSVAGINAL OB US TECHNIQUE: Both transabdominal and transvaginal ultrasound examinations were performed for complete evaluation of the gestation as well as the maternal uterus, adnexal regions, and pelvic cul-de-sac. Transvaginal technique was performed to assess early pregnancy. COMPARISON:  Pelvic ultrasound 08/24/2016. FINDINGS: Intrauterine gestational sac: Present. Yolk sac:  Present. Embryo:  Present. Cardiac Activity: Present. Heart Rate: 116  bpm CRL:  5.1  mm   6 w   1 d                  [place] EDC: 04/20/2017 Subchorionic hemorrhage: Small amount of heterogeneously hypoechoic fluid adjacent to the disc facial sac measuring 1.0 x 1.3 x 1.6 cm, compatible with a small subchorionic hemorrhage. Maternal uterus/adnexae: Retroverted uterus. Right ovary is normal in appearance. Probable degenerating corpus luteum cyst  in the left ovary. No significant volume of free fluid in the cul-de-sac. IMPRESSION: 1. Single viable IUP with estimated gestational age of [redacted] weeks and 1 day, and normal fetal heart rate of 116 beats per minute. 2. Small subchorionic hemorrhage. Electronically Signed   By: Trudie Reed M.D.   On: 08/26/2016 18:13   US Ob Transvaginal  Result Date: 08/27/2016 CLINICAL DATA:  Acute onset of heavy vaginal bleeding. Initial encounter. EXAM: TRANSVAGINAL OB ULTRASOUND TECHNIQUE: Transvaginal ultrasound was performed for complete evaluation of the gestation as well as the maternal uterus, adnexal regions, and pelvic cul-de-sac. COMPARISON:  Pelvic ultrasound performed 08/26/2016 FINDINGS: Intrauterine gestational sac: None seen. Yolk sac:  N/A Embryo:  N/A Subchorionic hemorrhage:  None visualized. Maternal uterus/adnexae: The uterus is grossly  unremarkable, with a small amount of blood noted at the endometrial echo complex. There is no definite evidence for retained products of conception at this time. The ovaries are unremarkable in appearance. The right ovary measures 3.5 x 1.6 x 1.4 cm, while the left ovary measures 3.0 x 1.5 x 1.5 cm. No suspicious adnexal masses are seen; there is no evidence for ovarian torsion. No free fluid is seen within the pelvic cul-de-sac. IMPRESSION: 1. No intrauterine gestational sac is now seen. This reflects recent spontaneous abortion. 2. No evidence for retained products of conception. Small amount of blood noted at the endometrial echo complex. Electronically Signed   By: Roanna RaiderJeffery  Chang M.D.   On: 08/27/2016 00:37   [place] Ob Transvaginal  Result Date: 08/26/2016 CLINICAL DATA:  30 year old with pregnant female with vaginal bleeding in the first trimester. EXAM: OBSTETRIC <14 WK US AND TRANSVAGINAL OB US TECHNIQUE: Both transabdominal and transvaginal ultrasound examinations were performed for complete evaluation of the gestation as well as the maternal uterus, adnexal  regions, and pelvic cul-de-sac. Transvaginal technique was performed to assess early pregnancy. COMPARISON:  Pelvic ultrasound 08/24/2016. FINDINGS: Intrauterine gestational sac: Present. Yolk sac:  Present. Embryo:  Present. Cardiac Activity: Present. Heart Rate: 116  bpm CRL:  5.1  mm   6 w   1 d                  US EDC: 04/20/2017 Subchorionic hemorrhage: Small amount of heterogeneously hypoechoic fluid adjacent to the disc facial sac measuring 1.0 x 1.3 x 1.6 cm, compatible with a small subchorionic hemorrhage. Maternal uterus/adnexae: Retroverted uterus. Right ovary is normal in appearance. Probable degenerating corpus luteum cyst in the left ovary. No significant volume of free fluid in the cul-de-sac. IMPRESSION: 1. Single viable IUP with estimated gestational age of [redacted] weeks and 1 day, and normal fetal heart rate of 116 beats per minute. 2. Small subchorionic hemorrhage. Electronically Signed   By: Trudie Reedaniel  Entrikin M.D.   On: 08/26/2016 18:13    MDM A positive GC/CT, wet prep, ultrasound Ultrasound shows SIUP with cardiac activity & [facility]  Assessment and Plan  A; 1. Subchorionic hematoma in first trimester, single or unspecified fetus   2. Vaginal bleeding in pregnancy, first trimester    P: Discharge home Pelvic rest GC/CT pending Start prenatal care with ob of choice  Judeth Hornrin Chardai Gangemi 08/26/2016, 4:26 PM

## 2016-08-26 NOTE — MAU Note (Signed)
Pt states she was having brown discharge on 12/24, had U[facility] &[facility], was sent home.  Bleeding is bright red today, sees it every time she wipes.  Pos HPT 3 weeks ago, also tested @ [facility].  Has mild intermittent cramping.

## 2016-08-26 NOTE — MAU Provider Note (Signed)
History     CSN: [id number]  Arrival date and time: 08/26/16 2233   First Provider Initiated Contact with Patient 08/26/16 [phone]     [id number] @[redacted]w[redacted]d  here with VB. She was seen earlier today for VB and [facility] She reports bleeding became heavier about 3 hrs ago and she passed tissue on her pad. She reports minimal cramping since.   OB History    Gravida Para Term Preterm AB Living   5 3 3  0 1 3   SAB TAB Ectopic Multiple Live Births   1 0 0 0 3      Past Medical History:  Diagnosis Date  . Anxiety 2014   PTSD  . Asthma   . Dental cavities    very poor dental hygeine  . Vaginal delivery 2006, 2011    Past Surgical History:  Procedure Laterality Date  . CESAREAN SECTION    . DILATION AND EVACUATION N/A 11/03/2014   Procedure: DILATATION AND EVACUATION;  Surgeon: Adam PhenixJames G Arnold, MD;  Location: [facility];  Service: Gynecology;  Laterality: N/A;    History reviewed. No pertinent family history.  Social History  Substance Use Topics  . Smoking status: Current Every Day Smoker    Packs/day: 0.50    Years: 15.00    Types: Cigarettes  . Smokeless tobacco: Never Used  . Alcohol use No    Allergies:  Allergies  Allergen Reactions  . Aspirin Nausea And Vomiting    Prescriptions Prior to Admission  Medication Sig Dispense Refill Last Dose  . Prenatal Vit-Fe Fumarate-FA (PRENATAL MULTIVITAMIN) TABS tablet Take 1 tablet by mouth daily at 12 noon.   08/25/2016 at Unknown time  . simethicone (MYLICON) 125 MG chewable tablet Chew 125 mg by mouth every 6 (six) hours as needed for flatulence.   Taking    Review of Systems  Constitutional: Negative.   Gastrointestinal: Positive for abdominal pain.   Physical Exam   Blood pressure 109/55, pulse 98, temperature 98.3 F (36.8 C), temperature source Oral, resp. rate 17, last menstrual period 07/07/2016, SpO2 99 %, unknown if currently breastfeeding.  Physical Exam  Constitutional: She is oriented to  person, place, and time. She appears well-developed and well-nourished. No distress (teary).  HENT:  Head: Normocephalic.  Neck: Normal range of motion.  Cardiovascular: Normal rate.   Respiratory: Effort normal.  GI: Soft. She exhibits no distension. There is no tenderness.  Genitourinary:  Genitourinary Comments: External: no lesions or erythema Vagina: rugated, parous, small drk red bloody discharge Uterus: non enlarged, anteverted, non tender, no CMT Adnexae: no masses, no tenderness left, no tenderness right   Musculoskeletal: Normal range of motion.  Neurological: She is alert and oriented to person, place, and time.  Skin: Skin is warm and dry.  Psychiatric: She has a normal mood and affect.   [place] Ob Comp Less 14 Wks  Result Date: 08/26/2016 CLINICAL DATA:  30 year old with pregnant female with vaginal bleeding in the first trimester. EXAM: OBSTETRIC <14 WK US AND TRANSVAGINAL OB US TECHNIQUE: Both transabdominal and transvaginal ultrasound examinations were performed for complete evaluation of the gestation as well as the maternal uterus, adnexal regions, and pelvic cul-de-sac. Transvaginal technique was performed to assess early pregnancy. COMPARISON:  Pelvic ultrasound 08/24/2016. FINDINGS: Intrauterine gestational sac: Present. Yolk sac:  Present. Embryo:  Present. Cardiac Activity: Present. Heart Rate: 116  bpm CRL:  5.1  mm   6 w   1 d  [place] EDC: 04/20/2017 Subchorionic hemorrhage: Small amount of heterogeneously hypoechoic fluid adjacent to the disc facial sac measuring 1.0 x 1.3 x 1.6 cm, compatible with a small subchorionic hemorrhage. Maternal uterus/adnexae: Retroverted uterus. Right ovary is normal in appearance. Probable degenerating corpus luteum cyst in the left ovary. No significant volume of free fluid in the cul-de-sac. IMPRESSION: 1. Single viable IUP with estimated gestational age of [redacted] weeks and 1 day, and normal fetal heart rate of 116 beats per minute.  2. Small subchorionic hemorrhage. Electronically Signed   By: Trudie Reed M.D.   On: 08/26/2016 18:13   US Ob Transvaginal  Result Date: 08/27/2016 CLINICAL DATA:  Acute onset of heavy vaginal bleeding. Initial encounter. EXAM: TRANSVAGINAL OB ULTRASOUND TECHNIQUE: Transvaginal ultrasound was performed for complete evaluation of the gestation as well as the maternal uterus, adnexal regions, and pelvic cul-de-sac. COMPARISON:  Pelvic ultrasound performed 08/26/2016 FINDINGS: Intrauterine gestational sac: None seen. Yolk sac:  N/A Embryo:  N/A Subchorionic hemorrhage:  None visualized. Maternal uterus/adnexae: The uterus is grossly unremarkable, with a small amount of blood noted at the endometrial echo complex. There is no definite evidence for retained products of conception at this time. The ovaries are unremarkable in appearance. The right ovary measures 3.5 x 1.6 x 1.4 cm, while the left ovary measures 3.0 x 1.5 x 1.5 cm. No suspicious adnexal masses are seen; there is no evidence for ovarian torsion. No free fluid is seen within the pelvic cul-de-sac. IMPRESSION: 1. No intrauterine gestational sac is now seen. This reflects recent spontaneous abortion. 2. No evidence for retained products of conception. Small amount of blood noted at the endometrial echo complex. Electronically Signed   By: Roanna Raider M.D.   On: 08/27/2016 00:37   US Ob Transvaginal  Result Date: 08/26/2016 CLINICAL DATA:  30 year old with pregnant female with vaginal bleeding in the first trimester. EXAM: OBSTETRIC <14 WK [place] AND TRANSVAGINAL OB US TECHNIQUE: Both transabdominal and transvaginal ultrasound examinations were performed for complete evaluation of the gestation as well as the maternal uterus, adnexal regions, and pelvic cul-de-sac. Transvaginal technique was performed to assess early pregnancy. COMPARISON:  Pelvic ultrasound 08/24/2016. FINDINGS: Intrauterine gestational sac: Present. Yolk sac:  Present. Embryo:   Present. Cardiac Activity: Present. Heart Rate: 116  bpm CRL:  5.1  mm   6 w   1 d                  [place] EDC: 04/20/2017 Subchorionic hemorrhage: Small amount of heterogeneously hypoechoic fluid adjacent to the disc facial sac measuring 1.0 x 1.3 x 1.6 cm, compatible with a small subchorionic hemorrhage. Maternal uterus/adnexae: Retroverted uterus. Right ovary is normal in appearance. Probable degenerating corpus luteum cyst in the left ovary. No significant volume of free fluid in the cul-de-sac. IMPRESSION: 1. Single viable IUP with estimated gestational age of [redacted] weeks and 1 day, and normal fetal heart rate of 116 beats per minute. 2. Small subchorionic hemorrhage. Electronically Signed   By: Trudie Reed M.D.   On: 08/26/2016 18:13   Results for orders placed or performed during the hospital encounter of 08/26/16 (from the past 24 hour(s))  CBC     Status: Abnormal   Collection Time: 08/27/16  1:08 AM  Result Value Ref Range   WBC 10.7 (H) 4.0 - 10.5 K/uL   RBC 4.21 3.87 - 5.11 MIL/uL   Hemoglobin 13.1 12.0 - 15.0 g/dL   HCT 16.1 09.6 - 04.5 %   MCV 88.1 78.0 -  100.0 fL   MCH 31.1 26.0 - 34.0 pg   MCHC 35.3 30.0 - 36.0 g/dL   RDW 09.812.8 11.911.5 - 14.715.5 %   Platelets 285 150 - 400 K/uL  hCG, quantitative, pregnancy     Status: Abnormal   Collection Time: 08/27/16  1:08 AM  Result Value Ref Range   hCG, Beta Chain, Quant, S 1,991 (H) <5 mIU/mL   MAU Course  Procedures  MDM Labs and US ordered and reviewed. Complete SAB. Support offered. POC to pathology. Stable for discharge home.   Assessment and Plan   1. SAB (spontaneous abortion)   2. Vaginal bleeding in pregnancy   3. Rh(D) positive    Discharge home Follow up in [facility] in 2 weeks  Repeat quants until negative Return precautions reviewed Ibuprofen prn  Allergies as of 08/27/2016      Reactions   Aspirin Nausea And Vomiting      Medication List    STOP taking these medications   prenatal multivitamin Tabs tablet    simethicone 125 MG chewable tablet Commonly known as:  Leone PayorMYLICON      Sandra Gilbert, CNM 08/26/2016, 11:52 PM

## 2016-08-26 NOTE — MAU Note (Signed)
Pt reports she was here earlier and after she left, states she started having heavy bleeding. States she passed something that looks like tissue. Mild cramping and bleeding has decreased.

## 2016-08-26 NOTE — Discharge Instructions (Signed)
Subchorionic Hematoma °A subchorionic hematoma is a gathering of blood between the outer wall of the placenta and the inner wall of the womb (uterus). The placenta is the organ that connects the fetus to the wall of the uterus. The placenta performs the feeding, breathing (oxygen to the fetus), and waste removal (excretory work) of the fetus.  °Subchorionic hematoma is the most common abnormality found on a result from ultrasonography done during the first trimester or early second trimester of pregnancy. If there has been little or no vaginal bleeding, early small hematomas usually shrink on their own and do not affect your baby or pregnancy. The blood is gradually absorbed over 1-2 weeks. When bleeding starts later in pregnancy or the hematoma is larger or occurs in an older pregnant woman, the outcome may not be as good. Larger hematomas may get bigger, which increases the chances for miscarriage. Subchorionic hematoma also increases the risk of premature detachment of the placenta from the uterus, preterm (premature) labor, and stillbirth. °HOME CARE INSTRUCTIONS °· Stay on bed rest if your health care provider recommends this. Although bed rest will not prevent more bleeding or prevent a miscarriage, your health care provider may recommend bed rest until you are advised otherwise. °· Avoid heavy lifting (more than 10 lb [4.5 kg]), exercise, sexual intercourse, or douching as directed by your health care provider. °· Keep track of the number of pads you use each day and how soaked (saturated) they are. Write down this information. °· Do not use tampons. °· Keep all follow-up appointments as directed by your health care provider. Your health care provider may ask you to have follow-up blood tests or ultrasound tests or both. °SEEK IMMEDIATE MEDICAL CARE IF: °· You have severe cramps in your stomach, back, abdomen, or pelvis. °· You have a fever. °· You pass large clots or tissue. Save any tissue for your health  care provider to look at. °· Your bleeding increases or you become lightheaded, feel weak, or have fainting episodes. °This information is not intended to replace advice given to you by your health care provider. Make sure you discuss any questions you have with your health care provider. °Document Released: 12/03/2006 Document Revised: 09/08/2014 Document Reviewed: 03/17/2013 °Elsevier Interactive Patient Education © 2017 [employer] ° °

## 2016-08-27 ENCOUNTER — Other Ambulatory Visit (HOSPITAL_COMMUNITY): Payer: Self-pay | Admitting: Radiology

## 2016-08-27 ENCOUNTER — Inpatient Hospital Stay (HOSPITAL_COMMUNITY): Payer: Medicaid Other

## 2016-08-27 ENCOUNTER — Other Ambulatory Visit (HOSPITAL_COMMUNITY): Payer: Medicaid Other

## 2016-08-27 ENCOUNTER — Ambulatory Visit (HOSPITAL_COMMUNITY): Payer: Medicaid Other

## 2016-08-27 DIAGNOSIS — Z6790 Unspecified blood type, Rh positive: Secondary | ICD-10-CM

## 2016-08-27 DIAGNOSIS — O039 Complete or unspecified spontaneous abortion without complication: Secondary | ICD-10-CM

## 2016-08-27 DIAGNOSIS — O4690 Antepartum hemorrhage, unspecified, unspecified trimester: Secondary | ICD-10-CM

## 2016-08-27 LAB — HCG, QUANTITATIVE, PREGNANCY: hCG, Beta Chain, Quant, S: 1991 m[IU]/mL — ABNORMAL HIGH (ref <5)

## 2016-08-27 LAB — CBC
HEMATOCRIT: 37.1 % (ref 36.0–46.0)
Hemoglobin: 13.1 g/dL (ref 12.0–15.0)
MCH: 31.1 pg (ref 26.0–34.0)
MCHC: 35.3 g/dL (ref 30.0–36.0)
MCV: 88.1 fL (ref 78.0–100.0)
Platelets: 285 10*3/uL (ref 150–400)
RBC: 4.21 MIL/uL (ref 3.87–5.11)
RDW: 12.8 % (ref 11.5–15.5)
WBC: 10.7 10*3/uL — ABNORMAL HIGH (ref 4.0–10.5)

## 2016-08-27 LAB — GC/CHLAMYDIA PROBE AMP (~~LOC~~) NOT AT ARMC
Chlamydia: NEGATIVE
Neisseria Gonorrhea: NEGATIVE

## 2016-08-27 MED ORDER — IBUPROFEN 800 MG PO TABS
800.0000 mg | ORAL_TABLET | Freq: Once | ORAL | Status: AC
  Administered 2016-08-27: 800 mg via ORAL
  Filled 2016-08-27: qty 1

## 2016-08-27 NOTE — Discharge Instructions (Signed)

## 2016-10-07 ENCOUNTER — Ambulatory Visit: Payer: Medicaid Other | Admitting: Obstetrics & Gynecology

## 2016-10-23 ENCOUNTER — Ambulatory Visit (INDEPENDENT_AMBULATORY_CARE_PROVIDER_SITE_OTHER): Payer: Medicaid Other | Admitting: Obstetrics & Gynecology

## 2016-10-23 ENCOUNTER — Encounter: Payer: Self-pay | Admitting: Obstetrics & Gynecology

## 2016-10-23 DIAGNOSIS — Z30013 Encounter for initial prescription of injectable contraceptive: Secondary | ICD-10-CM | POA: Insufficient documentation

## 2016-10-23 DIAGNOSIS — Z3042 Encounter for surveillance of injectable contraceptive: Secondary | ICD-10-CM | POA: Diagnosis not present

## 2016-10-23 MED ORDER — MEDROXYPROGESTERONE ACETATE 150 MG/ML IM SUSP
150.0000 mg | Freq: Once | INTRAMUSCULAR | Status: AC
  Administered 2016-10-23: 150 mg via INTRAMUSCULAR

## 2016-10-23 NOTE — Progress Notes (Signed)
Subjective:     Patient ID: Sandra Gilbert, female   DOB: 27-Jul-1986, 31 y.o.   MRN: [id number] Wants to start depo provera [id number] Patient's last menstrual period was 07/07/2016. S/p early SAB in 08/2016. Her partner will have a vasectomy but wants DMPA until he is sterilized. No intercourse since her miscarriage.  Allergies  Allergen Reactions  . Aspirin Nausea And Vomiting  . Latex     Rash,itchness in genital area.   Patient Active Problem List   Diagnosis Date Noted  . Initiation of Depo Provera 10/23/2016  . Missed abortion 11/03/2014      Review of Systems  Genitourinary: Positive for menstrual problem (cramps).  Psychiatric/Behavioral: Positive for agitation and dysphoric mood. The patient is nervous/anxious.        Objective:   Physical Exam  Constitutional: She is oriented to person, place, and time. She appears well-developed. No distress.  Pulmonary/Chest: Effort normal.  Neurological: She is alert and oriented to person, place, and time.  Psychiatric:  Seems agitated       Assessment:     May receive depo provera     Plan:     RTC 3 mo, yearly exam, pap  Adam PhenixJames G Arnold, MD 10/23/2016

## 2016-10-23 NOTE — Patient Instructions (Signed)
Hormonal Contraception Information Introduction Estrogen and progesterone (progestin) are hormones used in many forms of birth control (contraception). These two hormones make up most hormonal contraceptives. Hormonal contraceptives use either:  A combination of estrogen hormone and progesterone hormone in one of these forms:  Pill. Pills come in various combinations of active hormone pills and nonhormonal pills. Different combinations of pills may give you a period once a month, once every 3 months, or no period at all. It is important to take the pills the same time each day.  Patch. The patch is placed on the lower abdomen every week for 3 weeks. On the fourth week, the patch is not placed.  Vaginal ring. The ring is placed in the vagina and left there for 3 weeks. It is then removed for 1 week.  Progesterone alone in one of these forms:  Pill. Hormone pills are taken every day of the cycle.  Intrauterine device (IUD). The IUD is inserted during a menstrual period and removed or replaced every 5 years or sooner.  Implant. Plastic rods are placed under the skin of the upper arm. They are removed or replaced every 3 years or sooner.  Injection. The injection is given once every 90 days. Pregnancy can still occur with any of these hormonal contraceptive methods. If you have any suspicion that you might be pregnant, take a pregnancy test and talk to your health care provider. Estrogen and progesterone contraceptives Estrogen and progesterone contraceptives can prevent pregnancy by:  Stopping the release of an egg (ovulation).  Thickening the mucus of the cervix, making it difficult for sperm to enter the uterus.  Changing the lining of the uterus. This change makes it more difficult for an egg to implant. Progesterone contraceptives Progesterone-only contraceptives can prevent pregnancy by:  Blocking ovulation. This occurs in many women, but some women will continue to  ovulate.  Preventing the entry of sperm into the uterus by keeping the cervical mucus thick and sticky.  Changing the lining of the uterus. This change makes it more difficult for an egg to implant. Side effects Talk to your health care provider about what side effects may affect you. If you develop persistent side effects or if the effects are severe, talk to your health care provider.  Estrogen. Side effects from estrogen occur more often in the first 2-3 months. They include:  Progesterone. Side effects of progesterone can vary. They include: Questions to ask This information is not intended to replace advice given to you by your health care provider. Make sure you discuss any questions you have with your health care provider. Document Released: 09/07/2007 Document Revised: 05/21/2016 Document Reviewed: 01/30/2013  2017 Elsevier  

## 2016-10-24 LAB — POCT PREGNANCY, URINE: Preg Test, Ur: NEGATIVE

## 2017-01-14 IMAGING — US US OB COMP LESS 14 WK
1 series · 15 of 28 positions shown · non-contrast
Comparison: Pelvic ultrasound 08/24/2016.

CLINICAL DATA: 30-year-old with pregnant female with vaginal
bleeding in the first trimester.

EXAM:
OBSTETRIC <14 WK US AND TRANSVAGINAL OB US
TECHNIQUE: Both transabdominal and transvaginal ultrasound examinations were
performed for complete evaluation of the gestation as well as the
maternal uterus, adnexal regions, and pelvic cul-de-sac.
Transvaginal technique was performed to assess early pregnancy.

[Series 1: us ob comp less 14 wk · 15 of 68 slices shown]
[im 1/68]
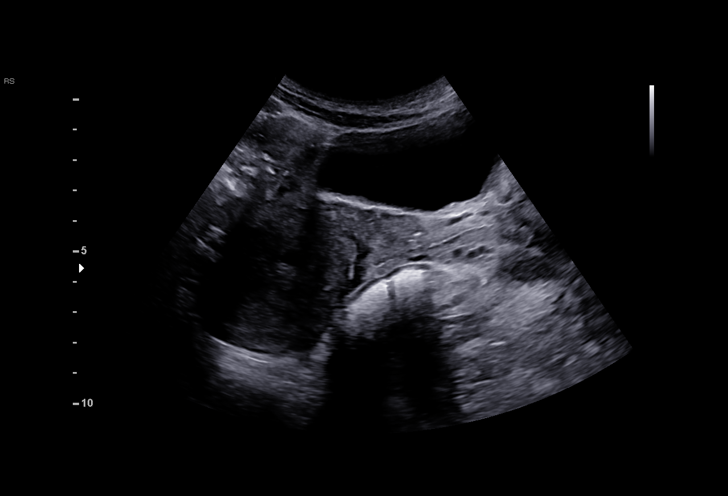
[im 5/68]
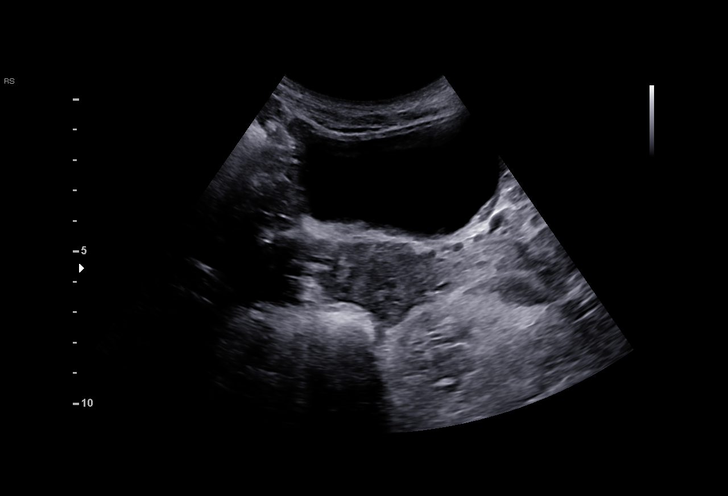
[im 10/68]
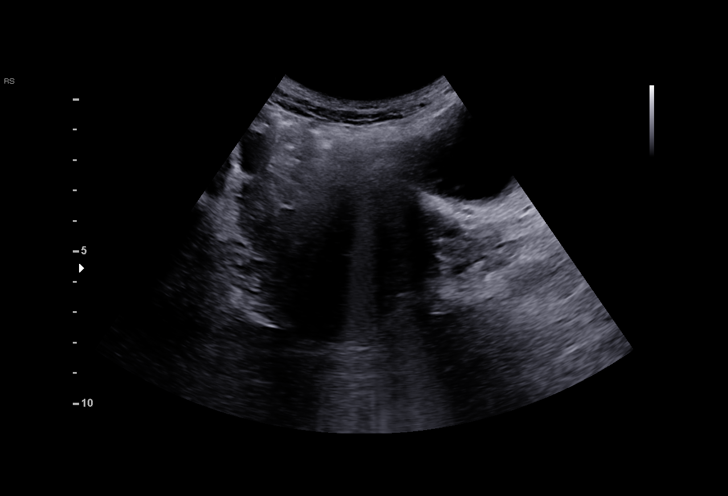
[im 15/68]
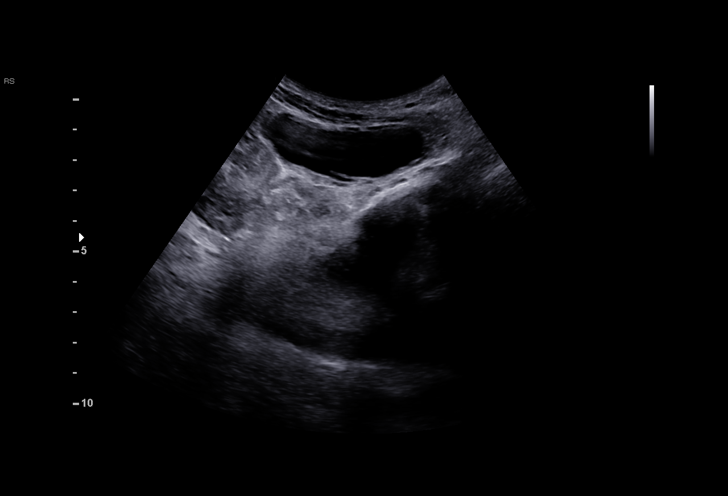
[im 20/68]
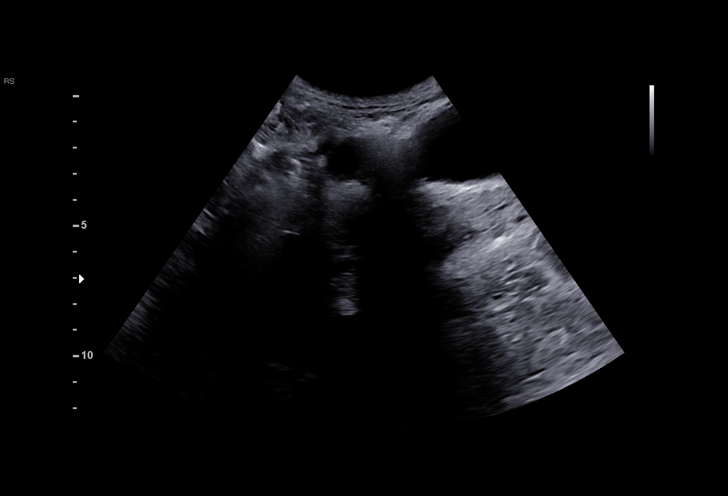
[im 25/68]
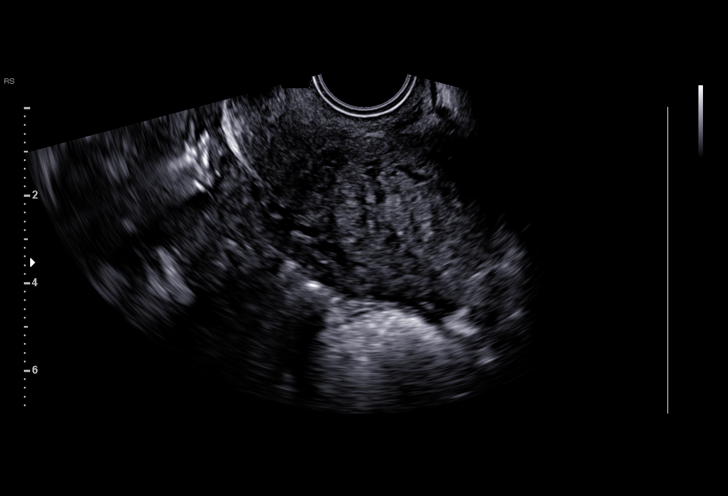
[im 30/68]
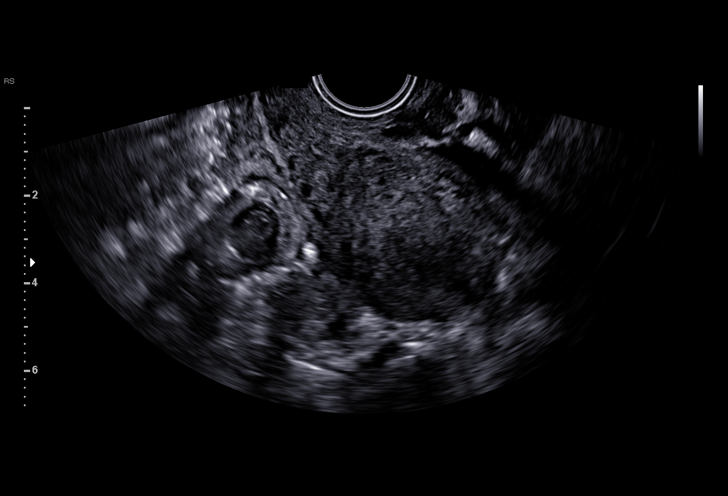
[im 35/68]
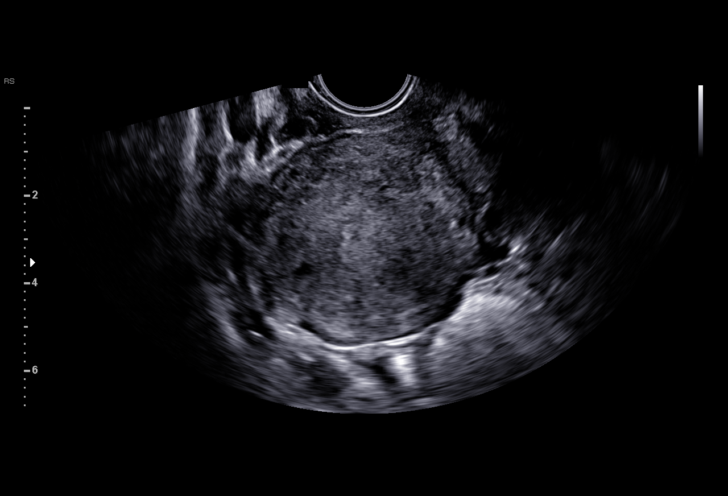
[im 38/68]
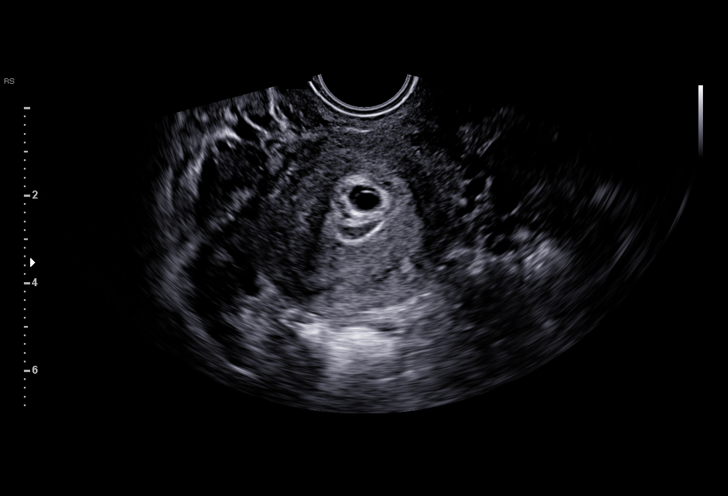
[im 43/68]
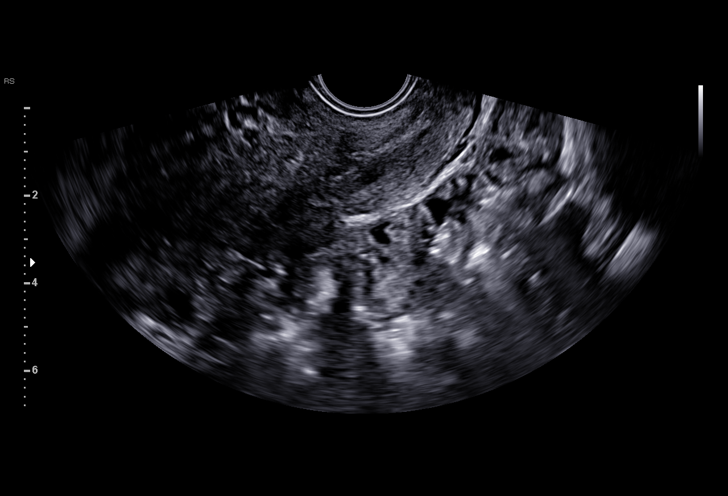
[im 48/68]
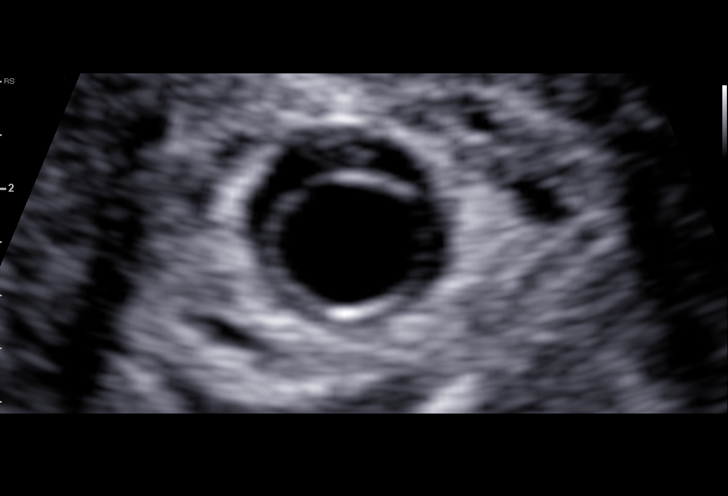
[im 53/68]
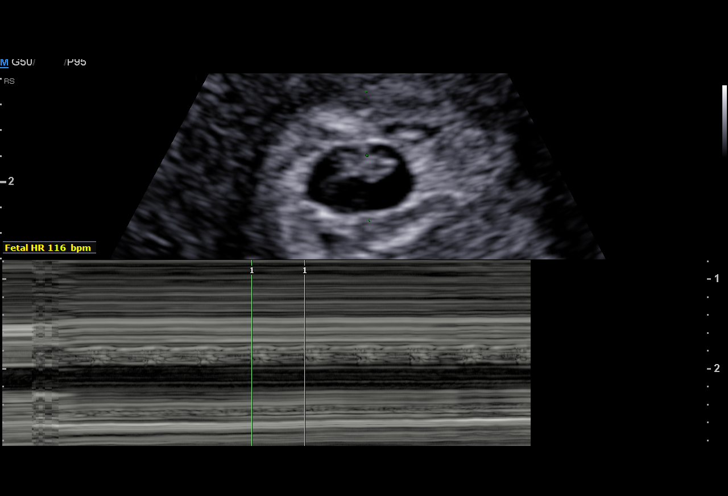
[im 58/68]
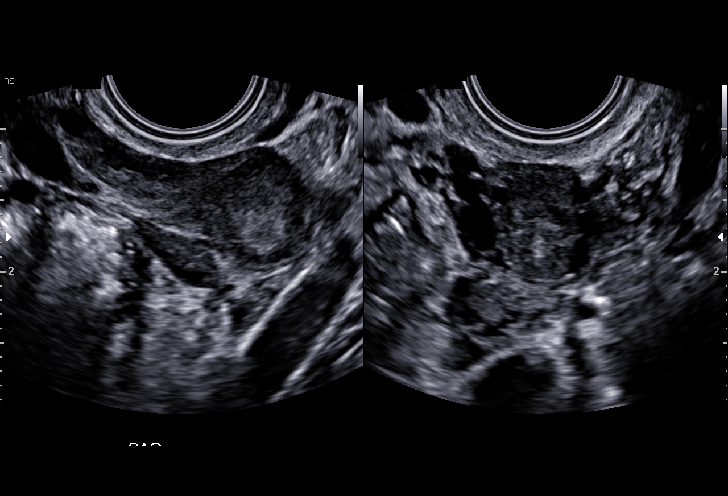
[im 63/68]
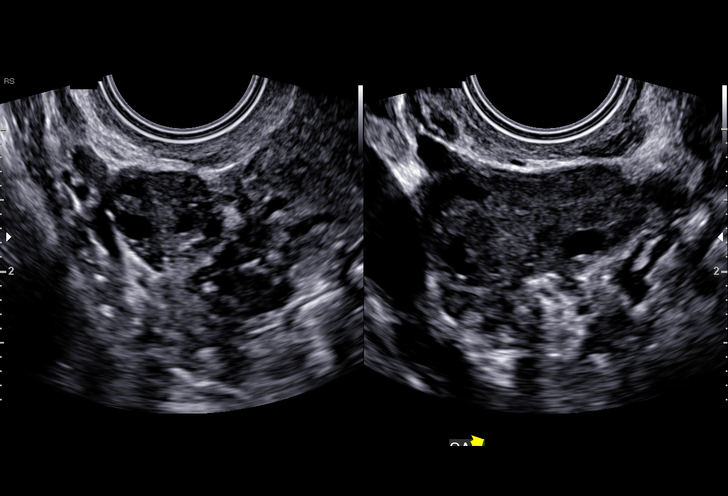
[im 68/68]
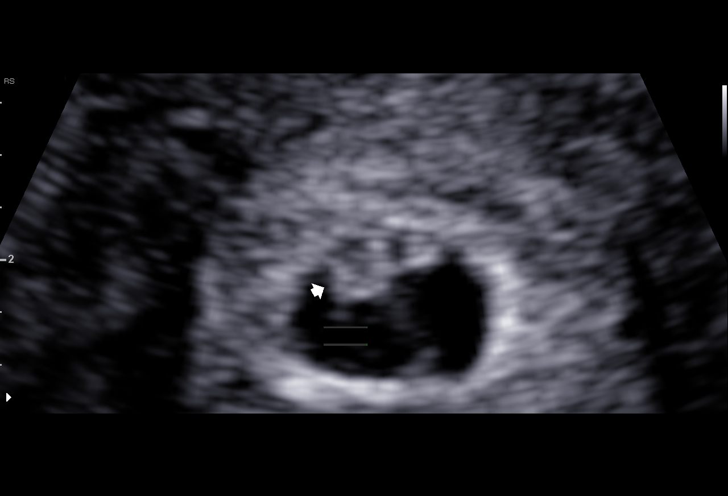

[15 of 28 positions shown; findings below may reference images not displayed]

FINDINGS: Intrauterine gestational sac: Present.

Yolk sac:  Present.

Embryo:  Present.

Cardiac Activity: Present.

Heart Rate: 116  bpm

CRL:  5.1  mm   6 w   1 d                  US EDC: 04/20/2017

Subchorionic hemorrhage: Small amount of heterogeneously hypoechoic
fluid adjacent to the disc facial sac measuring 1.0 x 1.3 x 1.6 cm,
compatible with a small subchorionic hemorrhage.

Maternal uterus/adnexae: Retroverted uterus. Right ovary is normal
in appearance. Probable degenerating corpus luteum cyst in the left
ovary. No significant volume of free fluid in the cul-de-sac.
IMPRESSION: 1. Single viable IUP with estimated gestational age of 6 weeks and 1
day, and normal fetal heart rate of 116 beats per minute.
2. Small subchorionic hemorrhage.

## 2018-12-02 DIAGNOSIS — F17200 Nicotine dependence, unspecified, uncomplicated: Secondary | ICD-10-CM

## 2018-12-02 HISTORY — DX: Nicotine dependence, unspecified, uncomplicated: F17.200

## 2020-11-19 ENCOUNTER — Encounter (HOSPITAL_BASED_OUTPATIENT_CLINIC_OR_DEPARTMENT_OTHER): Payer: Self-pay | Admitting: *Deleted

## 2020-11-19 ENCOUNTER — Emergency Department (HOSPITAL_BASED_OUTPATIENT_CLINIC_OR_DEPARTMENT_OTHER): Payer: Medicaid Other

## 2020-11-19 ENCOUNTER — Emergency Department (HOSPITAL_BASED_OUTPATIENT_CLINIC_OR_DEPARTMENT_OTHER)
Admission: EM | Admit: 2020-11-19 | Discharge: 2020-11-19 | Disposition: A | Discharge status: Home / Self Care | Payer: Medicaid Other | Attending: Emergency Medicine | Admitting: Emergency Medicine

## 2020-11-19 ENCOUNTER — Other Ambulatory Visit: Payer: Self-pay

## 2020-11-19 DIAGNOSIS — Z91040 Latex allergy status: Secondary | ICD-10-CM | POA: Diagnosis not present

## 2020-11-19 DIAGNOSIS — J45909 Unspecified asthma, uncomplicated: Secondary | ICD-10-CM | POA: Diagnosis not present

## 2020-11-19 DIAGNOSIS — R112 Nausea with vomiting, unspecified: Secondary | ICD-10-CM | POA: Diagnosis not present

## 2020-11-19 DIAGNOSIS — F17210 Nicotine dependence, cigarettes, uncomplicated: Secondary | ICD-10-CM | POA: Insufficient documentation

## 2020-11-19 DIAGNOSIS — R1010 Upper abdominal pain, unspecified: Secondary | ICD-10-CM | POA: Diagnosis not present

## 2020-11-19 LAB — COMPREHENSIVE METABOLIC PANEL
ALT: 14 U/L (ref 0–44)
AST: 16 U/L (ref 15–41)
Albumin: 4.4 g/dL (ref 3.5–5.0)
Alkaline Phosphatase: 72 U/L (ref 38–126)
Anion gap: 9 (ref 5–15)
BUN: 11 mg/dL (ref 6–20)
CO2: 26 mmol/L (ref 22–32)
Calcium: 8.9 mg/dL (ref 8.9–10.3)
Chloride: 103 mmol/L (ref 98–111)
Creatinine, Ser: 0.69 mg/dL (ref 0.44–1.00)
GFR, Estimated: >60 mL/min (ref >60)
Glucose, Bld: 94 mg/dL (ref 70–99)
Potassium: 4 mmol/L (ref 3.5–5.1)
Sodium: 138 mmol/L (ref 135–145)
Total Bilirubin: 0.8 mg/dL (ref 0.3–1.2)
Total Protein: 7.5 g/dL (ref 6.5–8.1)

## 2020-11-19 LAB — URINALYSIS, ROUTINE W REFLEX MICROSCOPIC
Bilirubin Urine: NEGATIVE
Glucose, UA: NEGATIVE mg/dL
Ketones, ur: NEGATIVE mg/dL
Leukocytes,Ua: NEGATIVE
Nitrite: NEGATIVE
Protein, ur: NEGATIVE mg/dL
Specific Gravity, Urine: 1.02 (ref 1.005–1.030)
pH: 7 (ref 5.0–8.0)

## 2020-11-19 LAB — URINALYSIS, MICROSCOPIC (REFLEX): WBC, UA: NONE SEEN WBC/hpf (ref 0–5)

## 2020-11-19 LAB — CBC
HCT: 44.4 % (ref 36.0–46.0)
Hemoglobin: 14.8 g/dL (ref 12.0–15.0)
MCH: 31 pg (ref 26.0–34.0)
MCHC: 33.3 g/dL (ref 30.0–36.0)
MCV: 92.9 fL (ref 80.0–100.0)
Platelets: 263 10*3/uL (ref 150–400)
RBC: 4.78 MIL/uL (ref 3.87–5.11)
RDW: 12.4 % (ref 11.5–15.5)
WBC: 11.7 10*3/uL — ABNORMAL HIGH (ref 4.0–10.5)
nRBC: 0 % (ref 0.0–0.2)

## 2020-11-19 LAB — LIPASE, BLOOD: Lipase: 35 U/L (ref 11–51)

## 2020-11-19 LAB — PREGNANCY, URINE: Preg Test, Ur: NEGATIVE

## 2020-11-19 MED ORDER — SODIUM CHLORIDE 0.9 % IV BOLUS
1000.0000 mL | Freq: Once | INTRAVENOUS | Status: AC
  Administered 2020-11-19: 1000 mL via INTRAVENOUS

## 2020-11-19 MED ORDER — ONDANSETRON 4 MG PO TBDP: 4.0000 mg | ORAL_TABLET | Freq: Three times a day (TID) | ORAL | 0 refills | Status: DC | PRN

## 2020-11-19 MED ORDER — OMEPRAZOLE 20 MG PO CPDR: 20.0000 mg | DELAYED_RELEASE_CAPSULE | Freq: Every day | ORAL | 0 refills | Status: DC

## 2020-11-19 MED ORDER — ONDANSETRON HCL 4 MG/2ML IJ SOLN
4.0000 mg | Freq: Once | INTRAMUSCULAR | Status: AC
  Administered 2020-11-19: 4 mg via INTRAVENOUS
  Filled 2020-11-19: qty 2

## 2020-11-19 MED ORDER — MORPHINE SULFATE (PF) 4 MG/ML IV SOLN
4.0000 mg | Freq: Once | INTRAVENOUS | Status: AC
Start: 2020-11-19 — End: 2020-11-19
  Administered 2020-11-19: 4 mg via INTRAVENOUS
  Filled 2020-11-19: qty 1

## 2020-11-19 NOTE — ED Notes (Signed)
Patient educated on discharge paper work. Pt verbalizes understanding. PIV removed. Vitals taken. Pt via wheelchair to husbands car. NAD. Respirations regular/unlabored.

## 2020-11-19 NOTE — ED Triage Notes (Signed)
Abdominal pain since this am. Nausea. She saw her MD today and he have her medication for the nausea. She had an xray that did not show any abnormalities.

## 2020-11-19 NOTE — ED Notes (Signed)
Patient transported to Ultrasound 

## 2020-11-19 NOTE — ED Provider Notes (Signed)
[facility] EMERGENCY DEPARTMENT Provider Note   CSN: [id number] Arrival date & time: 11/19/20  1559     History Chief Complaint  Patient presents with  . Abdominal Pain    Sandra Gilbert is a 35 y.o. female.  35 year old-year-old female presents with complaint of stabbing upper abdominal pain.  Patient states that she noticed the pain when she woke up this morning but thought that she may need to eat something to make her pain resolved.  Patient ate Mindi Slicker at which point she developed worsening of pain with severe nausea and vomiting.  Patient went to [facility] urgent care where she was sent to the emergency room for evaluation for possible gallbladder disease.  Patient reports for abdominal surgery of emergency C-section, states she is had some chills today with urinary frequency.  Denies dysuria, changes in bowel habits or fevers.  No other complaints or concerns today.  No recent alcohol use.        Past Medical History:  Diagnosis Date  . Anxiety 2014   PTSD  . Asthma   . Dental cavities    very poor dental hygeine  . Vaginal delivery 2006, 2011    Patient Active Problem List   Diagnosis Date Noted  . Initiation of Depo Provera 10/23/2016  . Missed abortion 11/03/2014    Past Surgical History:  Procedure Laterality Date  . CESAREAN SECTION    . DILATION AND EVACUATION N/A 11/03/2014   Procedure: DILATATION AND EVACUATION;  Surgeon: Adam Phenix, MD;  Location: [facility];  Service: Gynecology;  Laterality: N/A;     OB History    Gravida  5   Para  3   Term  3   Preterm  0   AB  1   Living  3     SAB  1   IAB  0   Ectopic  0   Multiple  0   Live Births  3           No family history on file.  Social History   Tobacco Use  . Smoking status: Current Every Day Smoker    Packs/day: 0.50    Years: 15.00    Pack years: 7.50    Types: Cigarettes  . Smokeless tobacco: Never Used  Substance Use Topics  . Alcohol use: No  .  Drug use: No    Home Medications Prior to Admission medications   Medication Sig Start Date End Date Taking? Authorizing Provider  omeprazole (PRILOSEC) 20 MG capsule Take 1 capsule (20 mg total) by mouth daily. 11/19/20 12/19/20 Yes Jeannie Fend, PA-C  ondansetron (ZOFRAN ODT) 4 MG disintegrating tablet Take 1 tablet (4 mg total) by mouth every 8 (eight) hours as needed for nausea or vomiting. 11/19/20  Yes Jeannie Fend, PA-C    Allergies    Aspirin and Latex  Review of Systems   Review of Systems  Constitutional: Positive for chills. Negative for fever.  Respiratory: Negative for shortness of breath.   Cardiovascular: Negative for chest pain.  Gastrointestinal: Positive for abdominal pain, nausea and vomiting. Negative for blood in stool, constipation and diarrhea.  Genitourinary: Positive for frequency. Negative for dysuria.  Musculoskeletal: Negative for arthralgias, back pain and myalgias.  Skin: Negative for rash and wound.  Allergic/Immunologic: Negative for immunocompromised state.  Neurological: Negative for weakness.  Hematological: Negative for adenopathy.  All other systems reviewed and are negative.   Physical Exam Updated Vital Signs BP 101/60  Pulse 85   Temp 97.8 F (36.6 C) (Oral)   Resp 16   Ht 5\' 2"  (1.575 m)   Wt 49 kg   SpO2 94%   BMI 19.76 kg/m   Physical Exam Vitals and nursing note reviewed.  Constitutional:      General: She is not in acute distress.    Appearance: She is well-developed. She is not diaphoretic.  HENT:     Head: Normocephalic and atraumatic.  Cardiovascular:     Rate and Rhythm: Normal rate and regular rhythm.     Heart sounds: Normal heart sounds.  Pulmonary:     Effort: Pulmonary effort is normal.     Breath sounds: Normal breath sounds.  Abdominal:     General: Bowel sounds are normal.     Palpations: Abdomen is soft.     Tenderness: There is abdominal tenderness in the right upper quadrant, epigastric area and  left upper quadrant. There is no right CVA tenderness or left CVA tenderness.  Skin:    General: Skin is warm and dry.     Findings: No erythema or rash.  Neurological:     Mental Status: She is alert and oriented to person, place, and time.  Psychiatric:        Behavior: Behavior normal.     ED Results / Procedures / Treatments   Labs (all labs ordered are listed, but only abnormal results are displayed) Labs Reviewed  CBC - Abnormal; Notable for the following components:      Result Value   WBC 11.7 (*)    All other components within normal limits  URINALYSIS, ROUTINE W REFLEX MICROSCOPIC - Abnormal; Notable for the following components:   Hgb urine dipstick MODERATE (*)    All other components within normal limits  URINALYSIS, MICROSCOPIC (REFLEX) - Abnormal; Notable for the following components:   Bacteria, UA RARE (*)    All other components within normal limits  LIPASE, BLOOD  COMPREHENSIVE METABOLIC PANEL  PREGNANCY, URINE    EKG None  Radiology Abdomen Complete  Result Date: 11/19/2020 CLINICAL DATA:  RIGHT upper quadrant pain.  35 year old female EXAM: ABDOMEN ULTRASOUND COMPLETE COMPARISON:  None. FINDINGS: Gallbladder: No gallstones or wall thickening visualized. No sonographic Murphy sign noted by sonographer. Common bile duct: Diameter: 3.2 mm Liver: No focal lesion identified. Within normal limits in parenchymal echogenicity. Portal vein is patent on color Doppler imaging with normal direction of blood flow towards the liver. IVC: No abnormality visualized. Pancreas: Visualized portion unremarkable. Spleen: Size and appearance within normal limits. Right Kidney: Length: 11.1 cm. Echogenicity within normal limits. No mass or hydronephrosis visualized. Left Kidney: Length: 9.8 cm. Echogenicity within normal limits. No mass or hydronephrosis visualized. Abdominal aorta: No aneurysm visualized. Other findings: None. IMPRESSION: Normal abdominal sonogram.  Electronically Signed   By: 34 M.D.   On: 11/19/2020 17:41    Procedures Procedures   Medications Ordered in ED Medications  sodium chloride 0.9 % bolus 1,000 mL (0 mLs Intravenous Stopped 11/19/20 1906)  ondansetron (ZOFRAN) injection 4 mg (4 mg Intravenous Given 11/19/20 1756)  morphine 4 MG/ML injection 4 mg (4 mg Intravenous Given 11/19/20 1757)    ED Course  I have reviewed the triage vital signs and the nursing notes.  Pertinent labs & imaging results that were available during my care of the patient were reviewed by me and considered in my medical decision making (see chart for details).  Clinical Course as of 11/19/20 1929  Mon  Nov 19, 2020  5244 35 year old female with complaint of upper abdominal pain, worse after eating today.  On exam, has mild tenderness to the upper abdomen.  Available labs reviewed with patient, mildly cytosis 11.7, possibly related to her vomiting.  Pregnancy test is negative, urinalysis with moderate hemoglobin without signs of infection, CMP with normal LFTs, renal function and bili.  Lipase within normal notes. Ultrasound abdomen ordered in triage is normal. [LM]  1820 Plan is for IV fluids and pain medication, p.o. challenge and if tolerating p.o.'s will discharge to recheck with PCP. [LM]    Clinical Course User Index [LM] Alden Hipp   MDM Rules/Calculators/A&P                          Final Clinical Impression(s) / ED Diagnoses Final diagnoses:  Pain of upper abdomen  Non-intractable vomiting with nausea, unspecified vomiting type    Rx / DC Orders ED Discharge Orders         Ordered    ondansetron (ZOFRAN ODT) 4 MG disintegrating tablet  Every 8 hours PRN        11/19/20 1821    omeprazole (PRILOSEC) 20 MG capsule  Daily        11/19/20 1821           Alden Hipp 11/19/20 1929    Pollyann Savoy, MD 11/19/20 2158

## 2020-11-19 NOTE — Discharge Instructions (Signed)
Take Zofran as needed as prescribed for nausea and vomiting.  Take Prilosec daily.  Follow-up with your doctor if symptoms continue. Bland diet, advance slowly as tolerated.

## 2020-12-24 ENCOUNTER — Encounter: Payer: Self-pay | Admitting: *Deleted

## 2020-12-24 ENCOUNTER — Encounter: Payer: Self-pay | Admitting: Cardiology

## 2020-12-24 DIAGNOSIS — F3181 Bipolar II disorder: Secondary | ICD-10-CM | POA: Insufficient documentation

## 2020-12-24 HISTORY — DX: Bipolar II disorder: F31.81

## 2020-12-31 DIAGNOSIS — K029 Dental caries, unspecified: Secondary | ICD-10-CM | POA: Insufficient documentation

## 2020-12-31 DIAGNOSIS — J45909 Unspecified asthma, uncomplicated: Secondary | ICD-10-CM | POA: Insufficient documentation

## 2020-12-31 DIAGNOSIS — J4489 Other specified chronic obstructive pulmonary disease: Secondary | ICD-10-CM | POA: Insufficient documentation

## 2021-01-02 ENCOUNTER — Ambulatory Visit: Payer: Medicaid Other | Admitting: Cardiology

## 2021-01-11 ENCOUNTER — Encounter: Payer: Self-pay | Admitting: Cardiology

## 2021-01-11 ENCOUNTER — Ambulatory Visit (INDEPENDENT_AMBULATORY_CARE_PROVIDER_SITE_OTHER): Payer: Medicaid Other | Admitting: Cardiology

## 2021-01-11 ENCOUNTER — Other Ambulatory Visit: Payer: Self-pay

## 2021-01-11 VITALS — BP 110/78 | HR 77 | Ht 64.0 in | Wt 110.2 lb

## 2021-01-11 DIAGNOSIS — I471 Supraventricular tachycardia: Secondary | ICD-10-CM | POA: Diagnosis not present

## 2021-01-11 DIAGNOSIS — R002 Palpitations: Secondary | ICD-10-CM | POA: Diagnosis not present

## 2021-01-11 DIAGNOSIS — R0602 Shortness of breath: Secondary | ICD-10-CM

## 2021-01-11 DIAGNOSIS — F419 Anxiety disorder, unspecified: Secondary | ICD-10-CM

## 2021-01-11 MED ORDER — PROPRANOLOL HCL 10 MG PO TABS: 10.0000 mg | ORAL_TABLET | Freq: Two times a day (BID) | ORAL | 3 refills | Status: DC

## 2021-01-11 NOTE — Progress Notes (Signed)
Cardiology Office Note:    Date:  01/11/2021   ID:  Sandra Gilbert, DOB 02/15/1986, MRN [id number]  PCP:  [facility]  Cardiologist:  Thomasene Ripple, DO  Electrophysiologist:  None   Referring MD: Eber Jones, NP   I have been dizzy recently with some palpitations  History of Present Illness:    Sandra Gilbert is a 35 y.o. female with a hx of PTSD, asthma, bipolar disorder, smoker here today to be evaluated for primary palpitations and shortness of breath.  The patient tells me that she over the last month has had significant palpitations.  Abrupt onset of fast heartbeat which lasted a few minutes and it resolved.  Nothing makes it better or worse.  A refill appointment.  Her PCP did close of monitoring the patient which show evidence of paroxysmal SVT.  Her other concern is the fact that she is short of breath on exertion.  She denies any chest pain.  She said about a year she has been experiencing significant shortness of breath on exertion.  Past Medical History:  Diagnosis Date  . Anxiety 2014   PTSD  . Asthma   . Bipolar II disorder (HCC) 12/24/2020  . Dental cavities    very poor dental hygeine  . Missed abortion 11/03/2014  . Smoker 12/02/2018  . Vaginal delivery 2006, 2011    Past Surgical History:  Procedure Laterality Date  . CESAREAN SECTION  2010  . DILATION AND EVACUATION N/A 11/03/2014   Procedure: DILATATION AND EVACUATION;  Surgeon: Adam Phenix, MD;  Location: [facility] OR[facility];  Service: Gynecology;  Laterality: N/A;    Current Medications: Current Meds  Medication Sig  . diclofenac (VOLTAREN) 50 MG EC tablet Take 50 mg by mouth 3 (three) times daily as needed.  Marland Kitchen ibuprofen (ADVIL) 200 MG tablet Take 200 mg by mouth every 6 (six) hours as needed for mild pain or moderate pain.  . Lidocaine HCl (ASPERCREME LIDOCAINE) 4 % CREA Apply 1 application topically 3 (three) times daily.  . propranolol (INDERAL) 10 MG tablet Take 1 tablet (10 mg total) by mouth 2  (two) times daily.  . Vitamin D, Ergocalciferol, (DRISDOL) 1.25 MG (50000 UNIT) CAPS capsule Take 50,000 Units by mouth every 7 (seven) days.     Allergies:   Aspirin and Latex   Social History   Socioeconomic History  . Marital status: Married    Spouse name: Not on file  . Number of children: Not on file  . Years of education: Not on file  . Highest education level: Not on file  Occupational History  . Not on file  Tobacco Use  . Smoking status: Current Every Day Smoker    Packs/day: 1.00    Years: 15.00    Pack years: 15.00    Types: Cigarettes  . Smokeless tobacco: Never Used  Substance and Sexual Activity  . Alcohol use: No  . Drug use: No  . Sexual activity: Not Currently    Birth control/protection: None  Other Topics Concern  . Not on file  Social History Narrative  . Not on file   Social Determinants of Health   Financial Resource Strain: Not on file  Food Insecurity: Not on file  Transportation Needs: Not on file  Physical Activity: Not on file  Stress: Not on file  Social Connections: Not on file     Family History: The patient's family history includes Diabetes in her mother; Heart disease in her mother; Muscular dystrophy  in her father.  ROS:   Review of Systems  Constitution: Negative for decreased appetite, fever and weight gain.  HENT: Negative for congestion, ear discharge, hoarse voice and sore throat.   Eyes: Negative for discharge, redness, vision loss in right eye and visual halos.  Cardiovascular: Negative for chest pain, dyspnea on exertion, leg swelling, orthopnea and palpitations.  Respiratory: Negative for cough, hemoptysis, shortness of breath and snoring.   Endocrine: Negative for heat intolerance and polyphagia.  Hematologic/Lymphatic: Negative for bleeding problem. Does not bruise/bleed easily.  Skin: Negative for flushing, nail changes, rash and suspicious lesions.  Musculoskeletal: Negative for arthritis, joint pain, muscle  cramps, myalgias, neck pain and stiffness.  Gastrointestinal: Negative for abdominal pain, bowel incontinence, diarrhea and excessive appetite.  Genitourinary: Negative for decreased libido, genital sores and incomplete emptying.  Neurological: Negative for brief paralysis, focal weakness, headaches and loss of balance.  Psychiatric/Behavioral: Negative for altered mental status, depression and suicidal ideas.  Allergic/Immunologic: Negative for HIV exposure and persistent infections.    EKGs/Labs/Other Studies Reviewed:    The following studies were reviewed today:   EKG: Sinus rhythm, heart rate 77 bpm with possible left atrial enlargement.  Her monitor which was placed on November 27, 2020 shows she wore this for 6 days 22 hours starting November 27, 2020.  Average heart rate was 85.  Minimal heart rate was 55, maximum heart rate 169.  No A. fib, no A-flutter no pauses.  There was an evidence of paroxysmal SVT with heart rate going up to 167.   Recent Labs: 11/19/2020: ALT 14; BUN 11; Creatinine, Ser 0.69; Hemoglobin 14.8; Platelets 263; Potassium 4.0; Sodium 138  Recent Lipid Panel No results found for: CHOL, TRIG, HDL, CHOLHDL, VLDL, LDLCALC, LDLDIRECT  Physical Exam:    VS:  BP 110/78 (BP Location: Right Arm)   Pulse 77   Ht 5\' 4"  (1.626 m)   Wt 110 lb 3.2 oz (50 kg)   SpO2 98%   BMI 18.92 kg/m     Wt Readings from Last 3 Encounters:  01/11/21 110 lb 3.2 oz (50 kg)  12/18/20 106 lb (48.1 kg)  11/19/20 108 lb 0.4 oz (49 kg)     GEN: Well nourished, well developed in no acute distress HEENT: Normal NECK: No JVD; No carotid bruits LYMPHATICS: No lymphadenopathy CARDIAC: S1S2 noted,RRR, no murmurs, rubs, gallops RESPIRATORY:  Clear to auscultation without rales, wheezing or rhonchi  ABDOMEN: Soft, non-tender, non-distended, +bowel sounds, no guarding. EXTREMITIES: No edema, No cyanosis, no clubbing MUSCULOSKELETAL:  No deformity  SKIN: Warm and dry NEUROLOGIC:  Alert and  oriented x 3, non-focal PSYCHIATRIC:  Normal affect, good insight  ASSESSMENT:    1. PSVT (paroxysmal supraventricular tachycardia) (HCC)   2. SOB (shortness of breath)    PLAN:    I will start the patient on propanolol 10 mg twice a day hopefully this will help with her symptoms given the fact that she has paroxysmal SVT and underlying anxiety.  In addition with her abnormal EKG showing evidence of left atrial enlargement her shortness of breath I am getting get echocardiogram to assess LV function and any other structural abnormalities.  The patient is in agreement with the above plan. The patient left the office in stable condition.  The patient will follow up in 8 weeks or sooner if needed.    Medication Adjustments/Labs and Tests Ordered: Current medicines are reviewed at length with the patient today.  Concerns regarding medicines are outlined above.  Orders Placed  This Encounter  Procedures  . EKG 12-Lead  . ECHOCARDIOGRAM COMPLETE   Meds ordered this encounter  Medications  . propranolol (INDERAL) 10 MG tablet    Sig: Take 1 tablet (10 mg total) by mouth 2 (two) times daily.    Dispense:  180 tablet    Refill:  3    Patient Instructions   Medication Instructions:  Your physician has recommended you make the following change in your medication:  START: Propranolol 10 mg twice daily *If you need a refill on your cardiac medications before your next appointment, please call your pharmacy*   Lab Work: None If you have labs (blood work) drawn today and your tests are completely normal, you will receive your results only by: Marland Kitchen. MyChart Message (if you have MyChart) OR . A paper copy in the mail If you have any lab test that is abnormal or we need to change your treatment, we will call you to review the results.   Testing/Procedures: Your physician has requested that you have an echocardiogram. Echocardiography is a painless test that uses sound waves to create images  of your heart. It provides your doctor with information about the size and shape of your heart and how well your heart's chambers and valves are working. This procedure takes approximately one hour. There are no restrictions for this procedure.     Follow-Up: At [facility], you and your health needs are our priority.  As part of our continuing mission to provide you with exceptional heart care, we have created designated Provider Care Teams.  These Care Teams include your primary Cardiologist (physician) and Advanced Practice Providers (APPs -  Physician Assistants and Nurse Practitioners) who all work together to provide you with the care you need, when you need it.  We recommend signing up for the patient portal called "MyChart".  Sign up information is provided on this After Visit Summary.  MyChart is used to connect with patients for Virtual Visits (Telemedicine).  Patients are able to view lab/test results, encounter notes, upcoming appointments, etc.  Non-urgent messages can be sent to your provider as well.   To learn more about what you can do with MyChart, go to [email or web address].    Your next appointment:   8 week(s)  The format for your next appointment:   In Person  Provider:   Thomasene RippleKardie Anntionette Madkins, DO   Other Instructions  Echocardiogram An echocardiogram is a test that uses sound waves (ultrasound) to produce images of the heart. Images from an echocardiogram can provide important information about:  Heart size and shape.  The size and thickness and movement of your heart's walls.  Heart muscle function and strength.  Heart valve function or if you have stenosis. Stenosis is when the heart valves are too narrow.  If blood is flowing backward through the heart valves (regurgitation).  A tumor or infectious growth around the heart valves.  Areas of heart muscle that are not working well because of poor blood flow or injury from a heart attack.  Aneurysm  detection. An aneurysm is a weak or damaged part of an artery wall. The wall bulges out from the normal force of blood pumping through the body. Tell a health care provider about:  Any allergies you have.  All medicines you are taking, including vitamins, herbs, eye drops, creams, and over-the-counter medicines.  Any blood disorders you have.  Any surgeries you have had.  Any medical conditions you have.  Whether you are pregnant or  may be pregnant. What are the risks? Generally, this is a safe test. However, problems may occur, including an allergic reaction to dye (contrast) that may be used during the test. What happens before the test? No specific preparation is needed. You may eat and drink normally. What happens during the test?  You will take off your clothes from the waist up and put on a hospital gown.  Electrodes or electrocardiogram (ECG)patches may be placed on your chest. The electrodes or patches are then connected to a device that monitors your heart rate and rhythm.  You will lie down on a table for an ultrasound exam. A gel will be applied to your chest to help sound waves pass through your skin.  A handheld device, called a transducer, will be pressed against your chest and moved over your heart. The transducer produces sound waves that travel to your heart and bounce back (or "echo" back) to the transducer. These sound waves will be captured in real-time and changed into images of your heart that can be viewed on a video monitor. The images will be recorded on a computer and reviewed by your health care provider.  You may be asked to change positions or hold your breath for a short time. This makes it easier to get different views or better views of your heart.  In some cases, you may receive contrast through an IV in one of your veins. This can improve the quality of the pictures from your heart. The procedure may vary among health care providers and hospitals.    What can I expect after the test? You may return to your normal, everyday life, including diet, activities, and medicines, unless your health care provider tells you not to do that. Follow these instructions at home:  It is up to you to get the results of your test. Ask your health care provider, or the department that is doing the test, when your results will be ready.  Keep all follow-up visits. This is important. Summary  An echocardiogram is a test that uses sound waves (ultrasound) to produce images of the heart.  Images from an echocardiogram can provide important information about the size and shape of your heart, heart muscle function, heart valve function, and other possible heart problems.  You do not need to do anything to prepare before this test. You may eat and drink normally.  After the echocardiogram is completed, you may return to your normal, everyday life, unless your health care provider tells you not to do that. This information is not intended to replace advice given to you by your health care provider. Make sure you discuss any questions you have with your health care provider. Document Revised: 04/10/2020 Document Reviewed: 04/10/2020 Elsevier Patient Education  2021 Elsevier Inc.     Adopting a Healthy Lifestyle.  Know what a healthy weight is for you (roughly BMI <25) and aim to maintain this   Aim for 7+ servings of fruits and vegetables daily   65-80+ fluid ounces of water or unsweet tea for healthy kidneys   Limit to max 1 drink of alcohol per day; avoid smoking/tobacco   Limit animal fats in diet for cholesterol and heart health - choose grass fed whenever available   Avoid highly processed foods, and foods high in saturated/trans fats   Aim for low stress - take time to unwind and care for your mental health   Aim for 150 min of moderate intensity exercise weekly for heart  health, and weights twice weekly for bone health   Aim for 7-9 hours of  sleep daily   When it comes to diets, agreement about the perfect plan isnt easy to find, even among the experts. Experts at the [facility] [employer] developed an idea known as the Healthy Eating Plate. Just imagine a plate divided into logical, healthy portions.   The emphasis is on diet quality:   Load up on vegetables and fruits - one-half of your plate: Aim for color and variety, and remember that potatoes dont count.   Go for whole grains - one-quarter of your plate: Whole wheat, barley, wheat berries, quinoa, oats, brown rice, and foods made with them. If you want pasta, go with whole wheat pasta.   Protein power - one-quarter of your plate: Fish, chicken, beans, and nuts are all healthy, versatile protein sources. Limit red meat.   The diet, however, does go beyond the plate, offering a few other suggestions.   Use healthy plant oils, such as olive, canola, soy, corn, sunflower and peanut. Check the labels, and avoid partially hydrogenated oil, which have unhealthy trans fats.   If youre thirsty, drink water. Coffee and tea are good in moderation, but skip sugary drinks and limit milk and dairy products to one or two daily servings.   The type of carbohydrate in the diet is more important than the amount. Some sources of carbohydrates, such as vegetables, fruits, whole grains, and beans-are healthier than others.   Finally, stay active  Signed, Thomasene Ripple, DO  01/11/2021 2:02 PM    [facility]

## 2021-01-11 NOTE — Patient Instructions (Addendum)
Medication Instructions:  Your physician has recommended you make the following change in your medication:  START: Propranolol 10 mg twice daily *If you need a refill on your cardiac medications before your next appointment, please call your pharmacy*   Lab Work: None If you have labs (blood work) drawn today and your tests are completely normal, you will receive your results only by: Marland Kitchen MyChart Message (if you have MyChart) OR . A paper copy in the mail If you have any lab test that is abnormal or we need to change your treatment, we will call you to review the results.   Testing/Procedures: Your physician has requested that you have an echocardiogram. Echocardiography is a painless test that uses sound waves to create images of your heart. It provides your doctor with information about the size and shape of your heart and how well your heart's chambers and valves are working. This procedure takes approximately one hour. There are no restrictions for this procedure.     Follow-Up: At [facility], you and your health needs are our priority.  As part of our continuing mission to provide you with exceptional heart care, we have created designated Provider Care Teams.  These Care Teams include your primary Cardiologist (physician) and Advanced Practice Providers (APPs -  Physician Assistants and Nurse Practitioners) who all work together to provide you with the care you need, when you need it.  We recommend signing up for the patient portal called "MyChart".  Sign up information is provided on this After Visit Summary.  MyChart is used to connect with patients for Virtual Visits (Telemedicine).  Patients are able to view lab/test results, encounter notes, upcoming appointments, etc.  Non-urgent messages can be sent to your provider as well.   To learn more about what you can do with MyChart, go to [email or web address].    Your next appointment:   8 week(s)  The format for your next  appointment:   In Person  Provider:   Thomasene Ripple, DO   Other Instructions  Echocardiogram An echocardiogram is a test that uses sound waves (ultrasound) to produce images of the heart. Images from an echocardiogram can provide important information about:  Heart size and shape.  The size and thickness and movement of your heart's walls.  Heart muscle function and strength.  Heart valve function or if you have stenosis. Stenosis is when the heart valves are too narrow.  If blood is flowing backward through the heart valves (regurgitation).  A tumor or infectious growth around the heart valves.  Areas of heart muscle that are not working well because of poor blood flow or injury from a heart attack.  Aneurysm detection. An aneurysm is a weak or damaged part of an artery wall. The wall bulges out from the normal force of blood pumping through the body. Tell a health care provider about:  Any allergies you have.  All medicines you are taking, including vitamins, herbs, eye drops, creams, and over-the-counter medicines.  Any blood disorders you have.  Any surgeries you have had.  Any medical conditions you have.  Whether you are pregnant or may be pregnant. What are the risks? Generally, this is a safe test. However, problems may occur, including an allergic reaction to dye (contrast) that may be used during the test. What happens before the test? No specific preparation is needed. You may eat and drink normally. What happens during the test?  You will take off your clothes from the waist up and  put on a hospital gown.  Electrodes or electrocardiogram (ECG)patches may be placed on your chest. The electrodes or patches are then connected to a device that monitors your heart rate and rhythm.  You will lie down on a table for an ultrasound exam. A gel will be applied to your chest to help sound waves pass through your skin.  A handheld device, called a transducer, will  be pressed against your chest and moved over your heart. The transducer produces sound waves that travel to your heart and bounce back (or "echo" back) to the transducer. These sound waves will be captured in real-time and changed into images of your heart that can be viewed on a video monitor. The images will be recorded on a computer and reviewed by your health care provider.  You may be asked to change positions or hold your breath for a short time. This makes it easier to get different views or better views of your heart.  In some cases, you may receive contrast through an IV in one of your veins. This can improve the quality of the pictures from your heart. The procedure may vary among health care providers and hospitals.   What can I expect after the test? You may return to your normal, everyday life, including diet, activities, and medicines, unless your health care provider tells you not to do that. Follow these instructions at home:  It is up to you to get the results of your test. Ask your health care provider, or the department that is doing the test, when your results will be ready.  Keep all follow-up visits. This is important. Summary  An echocardiogram is a test that uses sound waves (ultrasound) to produce images of the heart.  Images from an echocardiogram can provide important information about the size and shape of your heart, heart muscle function, heart valve function, and other possible heart problems.  You do not need to do anything to prepare before this test. You may eat and drink normally.  After the echocardiogram is completed, you may return to your normal, everyday life, unless your health care provider tells you not to do that. This information is not intended to replace advice given to you by your health care provider. Make sure you discuss any questions you have with your health care provider. Document Revised: 04/10/2020 Document Reviewed: 04/10/2020 Elsevier  Patient Education  2021 [employer].

## 2021-02-06 ENCOUNTER — Other Ambulatory Visit: Payer: Medicaid Other

## 2021-02-20 ENCOUNTER — Ambulatory Visit (INDEPENDENT_AMBULATORY_CARE_PROVIDER_SITE_OTHER): Payer: Medicaid Other

## 2021-02-20 ENCOUNTER — Other Ambulatory Visit: Payer: Self-pay

## 2021-02-20 DIAGNOSIS — R0602 Shortness of breath: Secondary | ICD-10-CM | POA: Diagnosis not present

## 2021-02-20 LAB — ECHOCARDIOGRAM COMPLETE
Area-P 1/2: 4.04 cm2
Calc EF: 55.9 %
S' Lateral: 3.3 cm
Single Plane A2C EF: 56.7 %
Single Plane A4C EF: 56.8 %

## 2021-02-20 NOTE — Progress Notes (Signed)
Complete echocardiogram performed.  Jimmy Oshae Simmering RDCS, RVT  

## 2021-02-27 ENCOUNTER — Other Ambulatory Visit: Payer: Self-pay

## 2021-03-01 ENCOUNTER — Ambulatory Visit (INDEPENDENT_AMBULATORY_CARE_PROVIDER_SITE_OTHER): Payer: Medicaid Other | Admitting: Cardiology

## 2021-03-01 ENCOUNTER — Other Ambulatory Visit: Payer: Self-pay

## 2021-03-01 ENCOUNTER — Encounter: Payer: Self-pay | Admitting: Cardiology

## 2021-03-01 VITALS — BP 110/60 | HR 80 | Ht 64.0 in | Wt 110.4 lb

## 2021-03-01 DIAGNOSIS — I471 Supraventricular tachycardia, unspecified: Secondary | ICD-10-CM

## 2021-03-01 DIAGNOSIS — F17200 Nicotine dependence, unspecified, uncomplicated: Secondary | ICD-10-CM

## 2021-03-01 DIAGNOSIS — R002 Palpitations: Secondary | ICD-10-CM

## 2021-03-01 DIAGNOSIS — R0600 Dyspnea, unspecified: Secondary | ICD-10-CM | POA: Diagnosis not present

## 2021-03-01 DIAGNOSIS — R0609 Other forms of dyspnea: Secondary | ICD-10-CM

## 2021-03-01 DIAGNOSIS — Z8709 Personal history of other diseases of the respiratory system: Secondary | ICD-10-CM

## 2021-03-01 DIAGNOSIS — R0602 Shortness of breath: Secondary | ICD-10-CM

## 2021-03-01 MED ORDER — PROPRANOLOL HCL 10 MG PO TABS: 10.0000 mg | ORAL_TABLET | Freq: Three times a day (TID) | ORAL | 3 refills | Status: DC

## 2021-03-01 NOTE — Patient Instructions (Signed)
Medication Instructions:  Your physician has recommended you make the following change in your medication:  START: Propranolol 10 mg every 8 hours *If you need a refill on your cardiac medications before your next appointment, please call your pharmacy*   Lab Work: None  If you have labs (blood work) drawn today and your tests are completely normal, you will receive your results only by: MyChart Message (if you have MyChart) OR A paper copy in the mail If you have any lab test that is abnormal or we need to change your treatment, we will call you to review the results.   Testing/Procedures:  Pulmonary Function Tests Pulmonary function tests (PFTs) are breathing tests that are used to measure how well your lungs work, find out what is causing your lung problems, and figure out the best treatment for you. You may have PFTs: When you have an illness involving your lungs. To watch for changes in your lung function over time if you have a long-term (chronic) lung disease. If you are an IT trainer. PFTs check the effects of being exposed to chemicals over a long period of time. To check for diseases that affect the lungs, such as asthma or chronic obstructive pulmonary disease (COPD). To check lung function before having surgery or other procedures. To check your lungs, if you smoke. To check if prescribed medicines or treatments are helping your lungs. Your results will be compared with the expected lung function of someone with healthy lungs who is similar to you in several ways. These include age, sex, height, weight, and race or ethnicity. This is done to show how your lung function compares with normal lung function (percent predicted). The percent predicted helps your health care provider to know if your lung function is normal or not. If you have had PFTs done before, your health care provider will compare your current results with past results. This shows ifyour lung function  is better, worse, or the same as before. Tell a health care provider about: Any allergies you have. All medicines you are taking, including inhaler or nebulizer medicines, vitamins, herbs, eye drops, creams, and over-the-counter medicines. Any blood disorders you have. Any surgeries you have had, especially recent surgery of the eye, abdomen, or chest. These can make PFTs difficult or unsafe. Any medical conditions you have, including chest pain or heart problems, tuberculosis, or respiratory infections such as pneumonia, a cold, or the flu. Any fear of being in closed spaces (claustrophobia). Some of your tests may be in a closed space. What are the risks? Generally, this is a safe procedure. However, problems may occur, including: Feeling light-headed due to fast, deep breathing known as over-breathing or hyperventilation. An asthma attack from deep breathing. What happens before the procedure? Take over-the-counter and prescription medicines only as told by your health care provider. If you take inhaler or nebulizer medicines, ask your health care provider which medicines you should take on the day of your testing. Some inhaler medicines may interfere with PFTs if they are taken shortly before the tests. Follow instructions from your health care provider about eating or drinking restrictions. These may include avoiding eating large meals and avoiding using caffeine before the testing. Do not drink alcohol for up to 4 hours before the test. Do not smoke for up to 4 hours before your procedure. This includes e-cigarettes. Wear comfortable clothing that will not interfere with breathing. Avoid exercise that takes a lot of effort (strenuous exercise) for at least 30  minutes before the testing. What happens during the procedure?  You will be given a soft nose clip to wear. This is done so all of your breaths will go through your mouth instead of your nose. You will be given a germ-free (sterile)  mouthpiece. It will be attached to a machine, called a spirometer, that measures your breathing. You will be asked to do various breathing exercises. The exercises will be done by breathing in (inhaling) and breathing out (exhaling). You may be asked to repeat the exercises several times before the testing is complete. It is important to follow the instructions exactly to get accurate results. Make sure to blow as hard and as fast as you can when you are told to do so. You may be given a medicine called a bronchodilator that makes the small air passages in your lungs larger. This medicine will make it easier for you to breathe. The tests will then be repeated after the medicine takes effect. You will be watched carefully during the procedure for any problems, such as feeling faint or dizzy, or having trouble breathing. The procedure may vary among health care providers and hospitals. What can I expect after the test procedure? It is up to you to get the results of your procedure. Ask your health care provider, or the department that is doing the procedure, when your results will be ready. After you have received your results, talk with your health careprovider about treatment options, if necessary. Follow these instructions at home: Do not use any products that contain nicotine or tobacco, such as cigarettes, e-cigarettes, and chewing tobacco. If you need help quitting, ask your healthcare provider. Summary Pulmonary function tests (PFTs) are used to measure how well your lungs work, find out what is causing your lung problems, and figure out the best treatment for you. If you have had PFTs done before, your health care provider will compare your current results with past results. This shows if your lung function is better, worse, or the same as before. Wear comfortable clothing that will not interfere with breathing when you are tested. It is up to you to get the results of your procedure. After you  have received them, talk with your health care provider about treatment options, if necessary. This information is not intended to replace advice given to you by your health care provider. Make sure you discuss any questions you have with your healthcare provider. Document Revised: 08/16/2019 Document Reviewed: 08/16/2019 Elsevier Patient Education  2022 [employer]   Follow-Up: At [facility], you and your health needs are our priority.  As part of our continuing mission to provide you with exceptional heart care, we have created designated Provider Care Teams.  These Care Teams include your primary Cardiologist (physician) and Advanced Practice Providers (APPs -  Physician Assistants and Nurse Practitioners) who all work together to provide you with the care you need, when you need it.  We recommend signing up for the patient portal called "MyChart".  Sign up information is provided on this After Visit Summary.  MyChart is used to connect with patients for Virtual Visits (Telemedicine).  Patients are able to view lab/test results, encounter notes, upcoming appointments, etc.  Non-urgent messages can be sent to your provider as well.   To learn more about what you can do with MyChart, go to [email or web address].    Your next appointment:   12 month(s)  The format for your next appointment:   In Person  Other Instructions

## 2021-03-01 NOTE — Progress Notes (Signed)
Cardiology Office Note:    Date:  03/01/2021   ID:  Sandra Gilbert, DOB 1986/06/05, MRN [id number]  PCP:  [facility]  Cardiologist:  Thomasene Ripple, DO  Electrophysiologist:  None   Referring MD: [facility]*   Chief Complaint  Patient presents with   Echo results    History of Present Illness:    Sandra Gilbert is a 35 y.o. female with a hx of PTSD, asthma, bipolar disorder, smoker is here today for follow-up visit.  Initially saw the patient on Jan 11, 2021 at that time there was concern for paroxysmal SVT I review her monitor we did show maximal heart rate 169.  We did start the patient on low-dose propanolol, she was also having shortness of breath an echocardiogram was ordered.  She has had some improvement on the palpitations but not completely resolved.  She still is short of breath.  She tells me going out into the hot weather really makes this worse.  When talking to the patient I realized that she is not being treated for asthma and she is a current smoker.  Past Medical History:  Diagnosis Date   Anxiety 2014   PTSD   Asthma    Bipolar II disorder (HCC) 12/24/2020   Dental cavities    very poor dental hygeine   Missed abortion 11/03/2014   Smoker 12/02/2018   Vaginal delivery 2006, 2011    Past Surgical History:  Procedure Laterality Date   CESAREAN SECTION  2010   DILATION AND EVACUATION N/A 11/03/2014   Procedure: DILATATION AND EVACUATION;  Surgeon: Adam Phenix, MD;  Location: [facility];  Service: Gynecology;  Laterality: N/A;    Current Medications: Current Meds  Medication Sig   propranolol (INDERAL) 10 MG tablet Take 1 tablet (10 mg total) by mouth 2 (two) times daily.   Vitamin D, Ergocalciferol, (DRISDOL) 1.25 MG (50000 UNIT) CAPS capsule Take 50,000 Units by mouth every 7 (seven) days. Last week     Allergies:   Aspirin and Latex   Social History   Socioeconomic History   Marital status: Married    Spouse name: Not on file    Number of children: Not on file   Years of education: Not on file   Highest education level: Not on file  Occupational History   Not on file  Tobacco Use   Smoking status: Every Day    Packs/day: 1.00    Years: 15.00    Pack years: 15.00    Types: Cigarettes   Smokeless tobacco: Never  Substance and Sexual Activity   Alcohol use: No   Drug use: No   Sexual activity: Not Currently    Birth control/protection: None  Other Topics Concern   Not on file  Social History Narrative   Not on file   Social Determinants of Health   Financial Resource Strain: Not on file  Food Insecurity: Not on file  Transportation Needs: Not on file  Physical Activity: Not on file  Stress: Not on file  Social Connections: Not on file     Family History: The patient's family history includes Diabetes in her mother; Heart disease in her mother; Muscular dystrophy in her father.  ROS:   Review of Systems  Constitution: Negative for decreased appetite, fever and weight gain.  HENT: Negative for congestion, ear discharge, hoarse voice and sore throat.   Eyes: Negative for discharge, redness, vision loss in right eye and visual halos.  Cardiovascular: Negative for  chest pain, dyspnea on exertion, leg swelling, orthopnea and palpitations.  Respiratory: Negative for cough, hemoptysis, shortness of breath and snoring.   Endocrine: Negative for heat intolerance and polyphagia.  Hematologic/Lymphatic: Negative for bleeding problem. Does not bruise/bleed easily.  Skin: Negative for flushing, nail changes, rash and suspicious lesions.  Musculoskeletal: Negative for arthritis, joint pain, muscle cramps, myalgias, neck pain and stiffness.  Gastrointestinal: Negative for abdominal pain, bowel incontinence, diarrhea and excessive appetite.  Genitourinary: Negative for decreased libido, genital sores and incomplete emptying.  Neurological: Negative for brief paralysis, focal weakness, headaches and loss of  balance.  Psychiatric/Behavioral: Negative for altered mental status, depression and suicidal ideas.  Allergic/Immunologic: Negative for HIV exposure and persistent infections.    EKGs/Labs/Other Studies Reviewed:    The following studies were reviewed today:   EKG: None today  Her monitor which was placed on November 27, 2020 shows she wore this for 6 days 22 hours starting November 27, 2020.  Average heart rate was 85.  Minimal heart rate was 55, maximum heart rate 169.  No A. fib, no A-flutter no pauses.  There was an evidence of paroxysmal SVT with heart rate going up to 167.   Transthoracic echocardiogram 02/20/2021 IMPRESSIONS   1. Left ventricular ejection fraction, by estimation, is 50 to 55%. The  left ventricle has low normal function. The left ventricle has no regional  wall motion abnormalities. Left ventricular diastolic parameters were  normal.   2. Right ventricular systolic function is normal. The right ventricular  size is normal. There is normal pulmonary artery systolic pressure.   3. The mitral valve is normal in structure. Trivial mitral valve regurgitation. No evidence of mitral stenosis.   4. The aortic valve is normal in structure. Aortic valve regurgitation is not visualized. No aortic stenosis is present.   5. The inferior vena cava is normal in size with greater than 50%  respiratory variability, suggesting right atrial pressure of 3 mmHg.   FINDINGS   Left Ventricle: Left ventricular ejection fraction, by estimation, is 50  to 55%. The left ventricle has low normal function. The left ventricle has  no regional wall motion abnormalities. The left ventricular internal  cavity size was normal in size.  There is no left ventricular hypertrophy. Left ventricular diastolic  parameters were normal.   Right Ventricle: The right ventricular size is normal. No increase in  right ventricular wall thickness. Right ventricular systolic function is  normal. There is normal  pulmonary artery systolic pressure. The tricuspid  regurgitant velocity is 2.39 m/s, and   with an assumed right atrial pressure of 3 mmHg, the estimated right  ventricular systolic pressure is 25.8 mmHg.   Left Atrium: Left atrial size was normal in size.   Right Atrium: Right atrial size was normal in size.   Pericardium: There is no evidence of pericardial effusion.   Mitral Valve: The mitral valve is normal in structure. Trivial mitral  valve regurgitation. No evidence of mitral valve stenosis.   Tricuspid Valve: The tricuspid valve is normal in structure. Tricuspid  valve regurgitation is trivial. No evidence of tricuspid stenosis.   Aortic Valve: The aortic valve is normal in structure. Aortic valve  regurgitation is not visualized. No aortic stenosis is present.   Pulmonic Valve: The pulmonic valve was normal in structure. Pulmonic valve  regurgitation is not visualized. No evidence of pulmonic stenosis.   Aorta: The aortic root is normal in size and structure.   Venous: The inferior vena cava  is normal in size with greater than 50%  respiratory variability, suggesting right atrial pressure of 3 mmHg.   IAS/Shunts: No atrial level shunt detected by color flow Doppler.   Recent Labs: 11/19/2020: ALT 14; BUN 11; Creatinine, Ser 0.69; Hemoglobin 14.8; Platelets 263; Potassium 4.0; Sodium 138  Recent Lipid Panel No results found for: CHOL, TRIG, HDL, CHOLHDL, VLDL, LDLCALC, LDLDIRECT  Physical Exam:    VS:  BP 110/60 (BP Location: Right Arm, Patient Position: Sitting)   Pulse 80   Ht 5\' 4"  (1.626 m)   Wt 110 lb 6.4 oz (50.1 kg)   SpO2 98%   BMI 18.95 kg/m     Wt Readings from Last 3 Encounters:  03/01/21 110 lb 6.4 oz (50.1 kg)  01/11/21 110 lb 3.2 oz (50 kg)  12/18/20 106 lb (48.1 kg)     GEN: Well nourished, well developed in no acute distress HEENT: Normal NECK: No JVD; No carotid bruits LYMPHATICS: No lymphadenopathy CARDIAC: S1S2 noted,RRR, no murmurs,  rubs, gallops RESPIRATORY:  Clear to auscultation without rales, wheezing or rhonchi  ABDOMEN: Soft, non-tender, non-distended, +bowel sounds, no guarding. EXTREMITIES: No edema, No cyanosis, no clubbing MUSCULOSKELETAL:  No deformity  SKIN: Warm and dry NEUROLOGIC:  Alert and oriented x 3, non-focal PSYCHIATRIC:  Normal affect, good insight  ASSESSMENT:    1. PSVT (paroxysmal supraventricular tachycardia) (HCC)   2. Dyspnea on exertion    PLAN:     1.  I am suspecting that her shortness of breath may be related to her on treated asthma.  As the patient is also a current smoker.  We will like to do is have her do a PFT and we will refer the patient to pulmonary for evaluation and possible treatment for asthma.  2.  In the meantime her palpitations are not completely resolved we will increase her propanolol to 10 mg every 8 hours.  3.  I did talk to the patient about smoking cessation and also offer our smoking cessation classes but she has declined and is not ready yet to quit smoking.   The patient is in agreement with the above plan. The patient left the office in stable condition.  The patient will follow up in   Medication Adjustments/Labs and Tests Ordered: Current medicines are reviewed at length with the patient today.  Concerns regarding medicines are outlined above.  No orders of the defined types were placed in this encounter.  No orders of the defined types were placed in this encounter.   There are no Patient Instructions on file for this visit.   Adopting a Healthy Lifestyle.  Know what a healthy weight is for you (roughly BMI <25) and aim to maintain this   Aim for 7+ servings of fruits and vegetables daily   65-80+ fluid ounces of water or unsweet tea for healthy kidneys   Limit to max 1 drink of alcohol per day; avoid smoking/tobacco   Limit animal fats in diet for cholesterol and heart health - choose grass fed whenever available   Avoid highly  processed foods, and foods high in saturated/trans fats   Aim for low stress - take time to unwind and care for your mental health   Aim for 150 min of moderate intensity exercise weekly for heart health, and weights twice weekly for bone health   Aim for 7-9 hours of sleep daily   When it comes to diets, agreement about the perfect plan isnt easy to find, even among the  experts. Experts at the [facility] [employer] developed an idea known as the Healthy Eating Plate. Just imagine a plate divided into logical, healthy portions.   The emphasis is on diet quality:   Load up on vegetables and fruits - one-half of your plate: Aim for color and variety, and remember that potatoes dont count.   Go for whole grains - one-quarter of your plate: Whole wheat, barley, wheat berries, quinoa, oats, brown rice, and foods made with them. If you want pasta, go with whole wheat pasta.   Protein power - one-quarter of your plate: Fish, chicken, beans, and nuts are all healthy, versatile protein sources. Limit red meat.   The diet, however, does go beyond the plate, offering a few other suggestions.   Use healthy plant oils, such as olive, canola, soy, corn, sunflower and peanut. Check the labels, and avoid partially hydrogenated oil, which have unhealthy trans fats.   If youre thirsty, drink water. Coffee and tea are good in moderation, but skip sugary drinks and limit milk and dairy products to one or two daily servings.   The type of carbohydrate in the diet is more important than the amount. Some sources of carbohydrates, such as vegetables, fruits, whole grains, and beans-are healthier than others.   Finally, stay active  Signed, Thomasene Ripple, DO  03/01/2021 10:40 AM    [facility]

## 2021-03-07 ENCOUNTER — Telehealth: Payer: Self-pay

## 2021-03-07 NOTE — Telephone Encounter (Signed)
Called  resp department, left patient information on the answer machine to they can schedule her PFT.  

## 2021-04-09 ENCOUNTER — Institutional Professional Consult (permissible substitution): Payer: Medicaid Other | Admitting: Pulmonary Disease

## 2021-04-09 IMAGING — US US ABDOMEN COMPLETE
1 series · 14 of 25 positions shown · non-contrast
Comparison: None.

CLINICAL DATA: RIGHT upper quadrant pain.  35-year-old female

EXAM:
ABDOMEN ULTRASOUND COMPLETE

[Series 1: us abdomen complete · 14 of 70 slices shown]
[im 1/70]
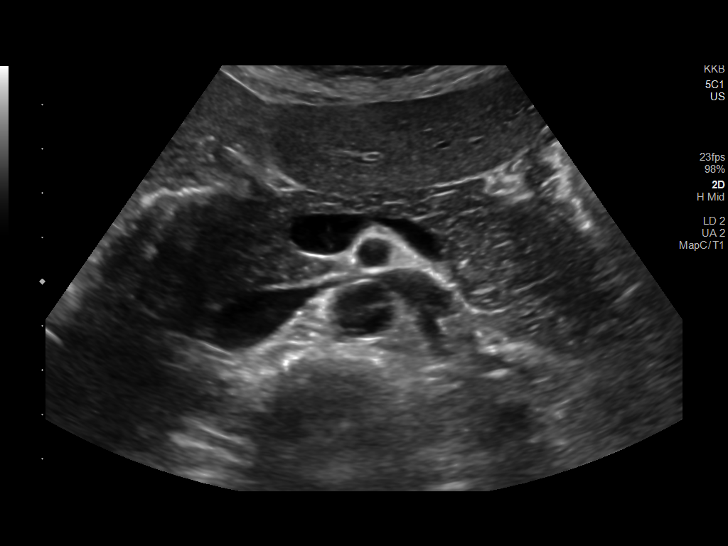
[im 6/70]
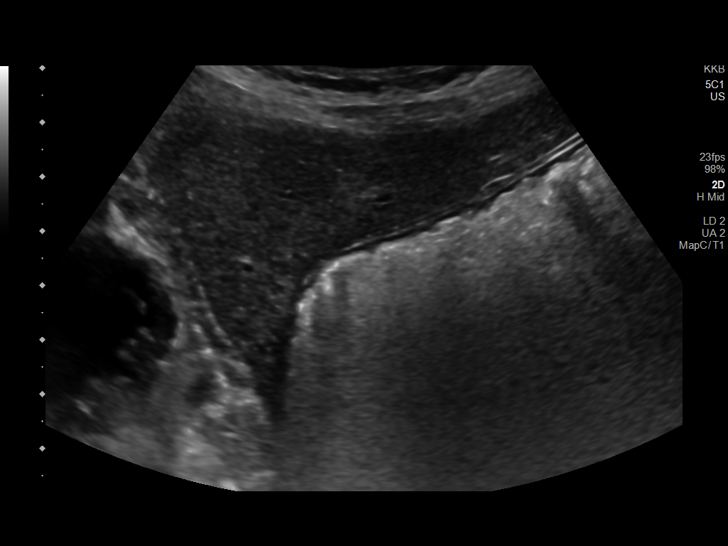
[im 12/70]
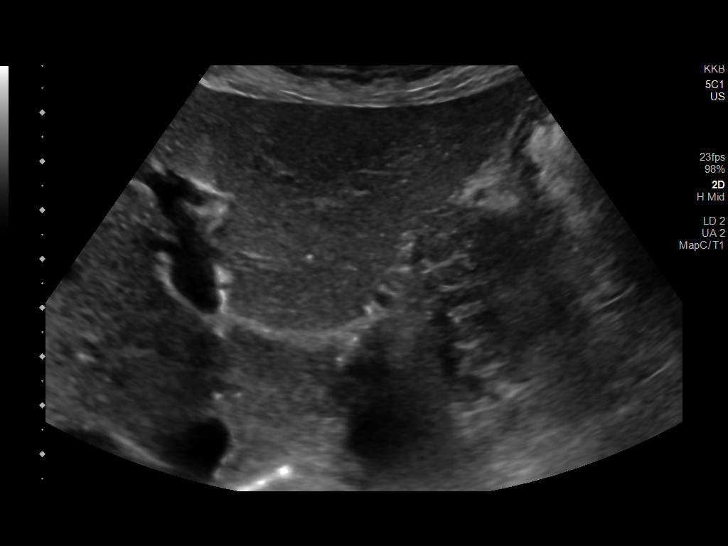
[im 18/70]
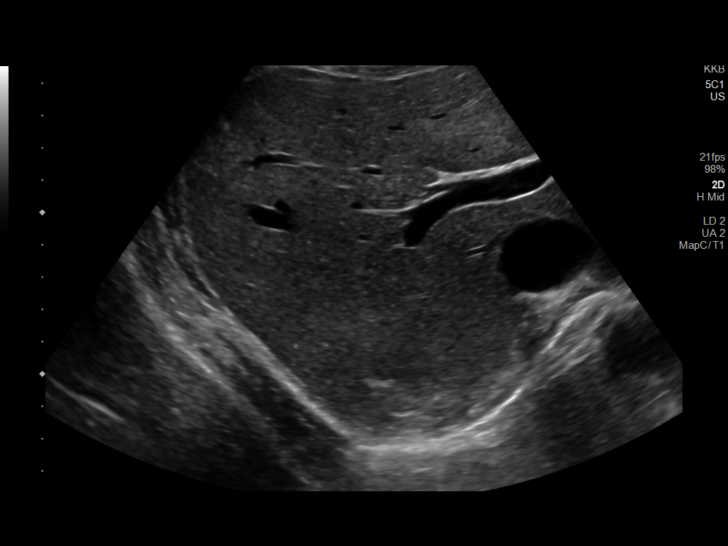
[im 24/70]
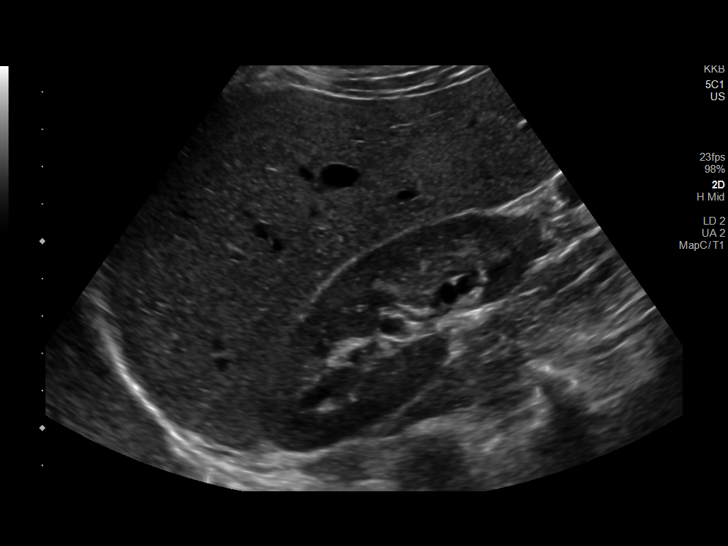
[im 26/70]
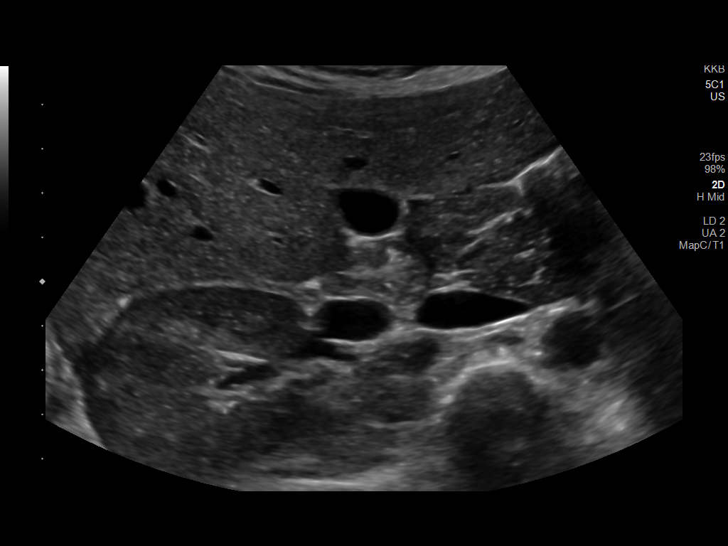
[im 32/70]
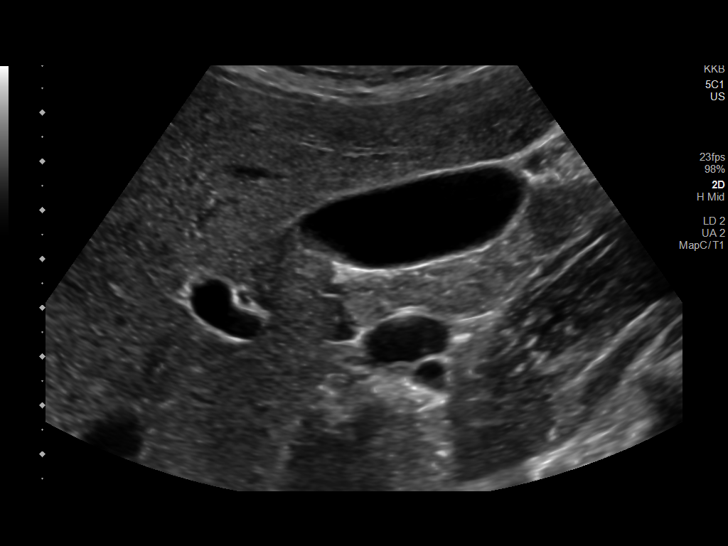
[im 38/70]
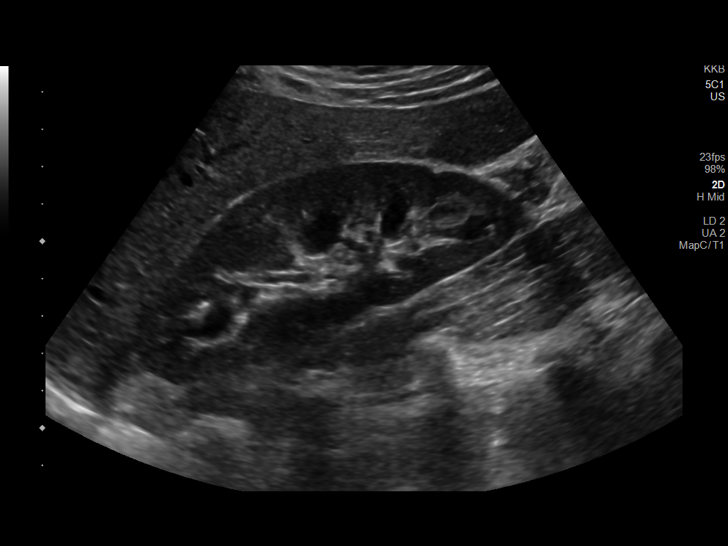
[im 44/70]
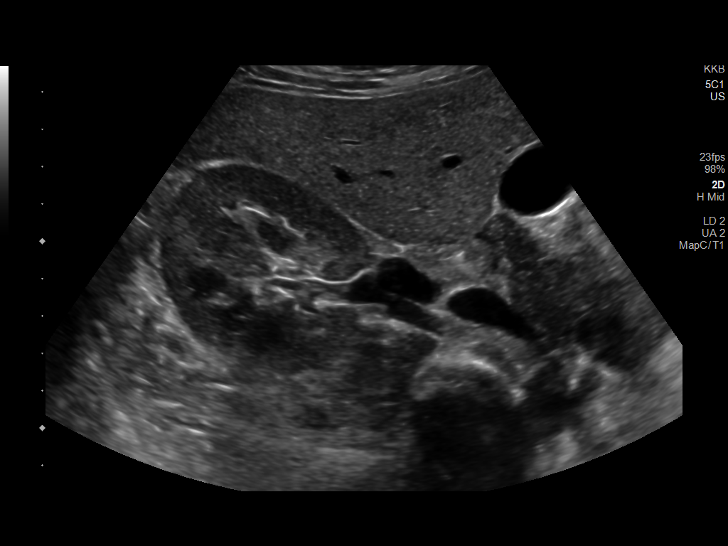
[im 47/70]
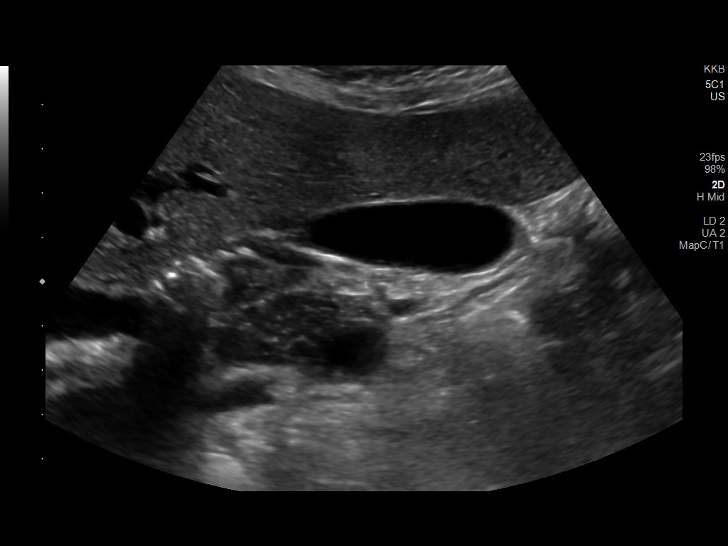
[im 52/70]
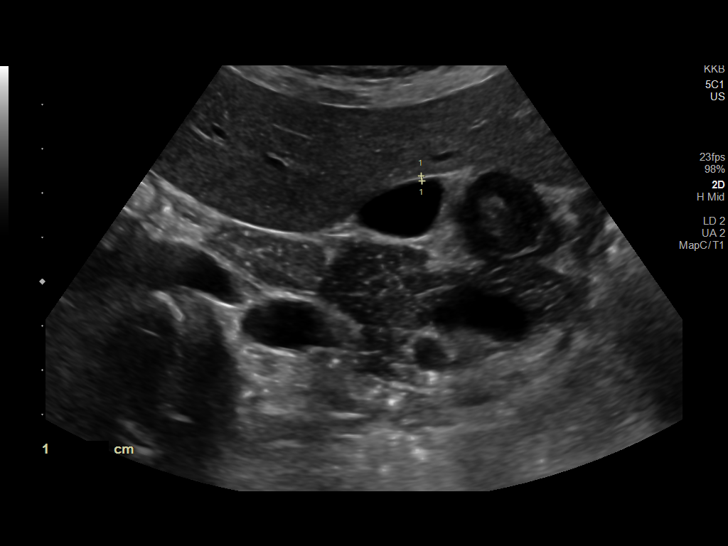
[im 58/70]
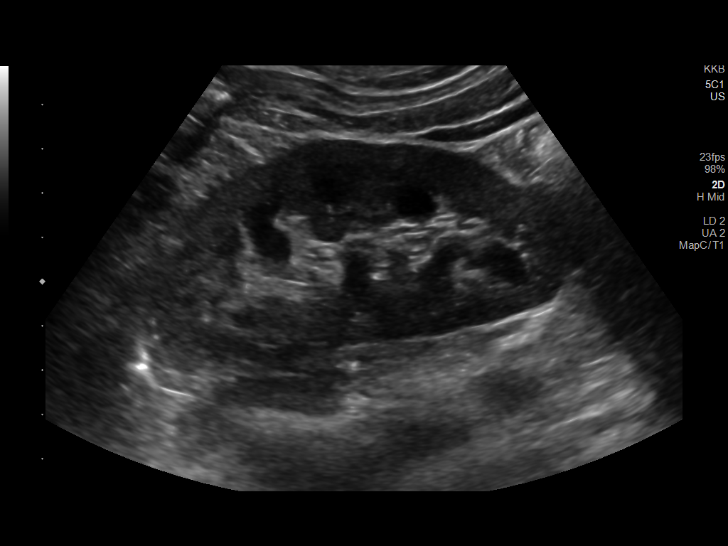
[im 64/70]
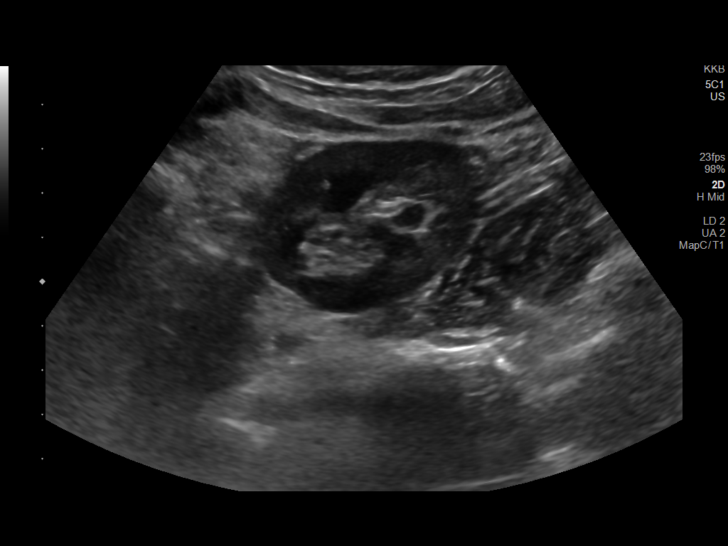
[im 70/70]
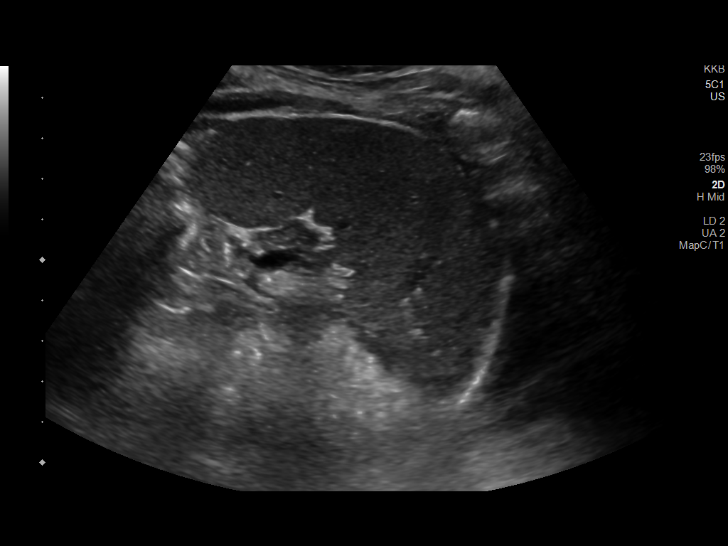

[14 of 25 positions shown; findings below may reference images not displayed]

FINDINGS: Gallbladder: No gallstones or wall thickening visualized. No
sonographic Murphy sign noted by sonographer.

Common bile duct: Diameter: 3.2 mm

Liver: No focal lesion identified. Within normal limits in
parenchymal echogenicity. Portal vein is patent on color Doppler
imaging with normal direction of blood flow towards the liver.

IVC: No abnormality visualized.

Pancreas: Visualized portion unremarkable.

Spleen: Size and appearance within normal limits.

Right Kidney: Length: 11.1 cm. Echogenicity within normal limits. No
mass or hydronephrosis visualized.

Left Kidney: Length: 9.8 cm. Echogenicity within normal limits. No
mass or hydronephrosis visualized.

Abdominal aorta: No aneurysm visualized.

Other findings: None.
IMPRESSION: Normal abdominal sonogram.

## 2021-04-09 NOTE — Progress Notes (Deleted)
Synopsis: Referred in August 2022 for shortness of breath/asthma by Thomasene Ripple, DO  Subjective:   PATIENT ID: Sandra Gilbert GENDER: female DOB: 17-Nov-1985, MRN: [id number]   HPI  No chief complaint on file.   Sandra Gilbert is a 35 year old woman, daily smoker with with history of asthma and SVT who is referred to pulmonary clinic for shortness of breath.     Past Medical History:  Diagnosis Date   Anxiety 2014   PTSD   Asthma    Bipolar II disorder (HCC) 12/24/2020   Dental cavities    very poor dental hygeine   Missed abortion 11/03/2014   Smoker 12/02/2018   Vaginal delivery 2006, 2011     Family History  Problem Relation Age of Onset   Diabetes Mother    Heart disease Mother    Muscular dystrophy Father      Social History   Socioeconomic History   Marital status: Married    Spouse name: Not on file   Number of children: Not on file   Years of education: Not on file   Highest education level: Not on file  Occupational History   Not on file  Tobacco Use   Smoking status: Every Day    Packs/day: 1.00    Years: 15.00    Pack years: 15.00    Types: Cigarettes   Smokeless tobacco: Never  Substance and Sexual Activity   Alcohol use: No   Drug use: No   Sexual activity: Not Currently    Birth control/protection: None  Other Topics Concern   Not on file  Social History Narrative   Not on file   Social Determinants of Health   Financial Resource Strain: Not on file  Food Insecurity: Not on file  Transportation Needs: Not on file  Physical Activity: Not on file  Stress: Not on file  Social Connections: Not on file  Intimate Partner Violence: Not on file     Allergies  Allergen Reactions   Aspirin Nausea And Vomiting   Latex     Rash,itchness in genital area.     Outpatient Medications Prior to Visit  Medication Sig Dispense Refill   propranolol (INDERAL) 10 MG tablet Take 1 tablet (10 mg total) by mouth every 8 (eight) hours. 270 tablet 3    Vitamin D, Ergocalciferol, (DRISDOL) 1.25 MG (50000 UNIT) CAPS capsule Take 50,000 Units by mouth every 7 (seven) days. Last week     No facility-administered medications prior to visit.    Review of Systems  Constitutional:  Negative for chills, fever, malaise/fatigue and weight loss.  HENT:  Negative for congestion, sinus pain and sore throat.   Eyes: Negative.   Respiratory:  Negative for cough, hemoptysis, sputum production, shortness of breath and wheezing.   Cardiovascular:  Negative for chest pain, palpitations, orthopnea, claudication and leg swelling.  Gastrointestinal:  Negative for abdominal pain, heartburn, nausea and vomiting.  Genitourinary: Negative.   Musculoskeletal:  Negative for joint pain and myalgias.  Skin:  Negative for rash.  Neurological:  Negative for weakness.  Endo/Heme/Allergies: Negative.   Psychiatric/Behavioral: Negative.       Objective:  There were no vitals filed for this visit.   Physical Exam Constitutional:      General: She is not in acute distress.    Appearance: She is not ill-appearing.  HENT:     Head: Normocephalic and atraumatic.  Eyes:     General: No scleral icterus.    Conjunctiva/sclera: Conjunctivae normal.  Pupils: Pupils are equal, round, and reactive to light.  Cardiovascular:     Rate and Rhythm: Normal rate and regular rhythm.     Pulses: Normal pulses.     Heart sounds: Normal heart sounds. No murmur heard. Pulmonary:     Effort: Pulmonary effort is normal.     Breath sounds: Normal breath sounds. No wheezing, rhonchi or rales.  Abdominal:     General: Bowel sounds are normal.     Palpations: Abdomen is soft.  Musculoskeletal:     Right lower leg: No edema.     Left lower leg: No edema.  Lymphadenopathy:     Cervical: No cervical adenopathy.  Skin:    General: Skin is warm and dry.  Neurological:     General: No focal deficit present.     Mental Status: She is alert.  Psychiatric:        Mood and  Affect: Mood normal.        Behavior: Behavior normal.        Thought Content: Thought content normal.        Judgment: Judgment normal.    CBC    Component Value Date/Time   WBC 11.7 (H) 11/19/2020 1612   RBC 4.78 11/19/2020 1612   HGB 14.8 11/19/2020 1612   HCT 44.4 11/19/2020 1612   PLT 263 11/19/2020 1612   MCV 92.9 11/19/2020 1612   MCH 31.0 11/19/2020 1612   MCHC 33.3 11/19/2020 1612   RDW 12.4 11/19/2020 1612   BMP Latest Ref Rng & Units 11/19/2020  Glucose 70 - 99 mg/dL 94  BUN 6 - 20 mg/dL 11  Creatinine 4.28 - 7.68 mg/dL 1.15  Sodium 726 - 203 mmol/L 138  Potassium 3.5 - 5.1 mmol/L 4.0  Chloride 98 - 111 mmol/L 103  CO2 22 - 32 mmol/L 26  Calcium 8.9 - 10.3 mg/dL 8.9   Chest imaging:  PFT: No flowsheet data found.  Labs:  Path:  Echo 02/20/21:  Heart Catheterization:       Assessment & Plan:   No diagnosis found.  Discussion: ***    Current Outpatient Medications:    propranolol (INDERAL) 10 MG tablet, Take 1 tablet (10 mg total) by mouth every 8 (eight) hours., Disp: 270 tablet, Rfl: 3   Vitamin D, Ergocalciferol, (DRISDOL) 1.25 MG (50000 UNIT) CAPS capsule, Take 50,000 Units by mouth every 7 (seven) days. Last week, Disp: , Rfl:

## 2022-02-24 ENCOUNTER — Other Ambulatory Visit: Payer: Self-pay | Admitting: Cardiology

## 2022-02-24 DIAGNOSIS — R0602 Shortness of breath: Secondary | ICD-10-CM

## 2022-02-24 DIAGNOSIS — R0609 Other forms of dyspnea: Secondary | ICD-10-CM

## 2023-03-30 ENCOUNTER — Other Ambulatory Visit: Payer: Self-pay

## 2023-03-30 DIAGNOSIS — F32A Depression, unspecified: Secondary | ICD-10-CM | POA: Insufficient documentation

## 2023-03-30 DIAGNOSIS — R002 Palpitations: Secondary | ICD-10-CM

## 2023-03-30 DIAGNOSIS — R9431 Abnormal electrocardiogram [ECG] [EKG]: Secondary | ICD-10-CM

## 2023-03-30 DIAGNOSIS — R0602 Shortness of breath: Secondary | ICD-10-CM

## 2023-04-21 NOTE — Progress Notes (Unsigned)
NEW PATIENT Date of Service/Encounter:  04/23/23 Referring provider: [facility]* Primary care provider: Sherlie Ban, NP  Subjective:  Sandra Gilbert is a 37 y.o. female with a PMHx of ADHD, PTSD, bipolar II, anxiety, PSVT presenting today for evaluation of concern for food allergy. History obtained from: chart review and patient.   Concern for food allergies:  Ate a Mediterranean salad with pepper from her garden a few months ago. Pepper was extremely spicy.  She drank milk to help with the extreme burning sensation. Felt she swallowed a cactus, 6 hours later went to UC and had rash and trouble breathing. Her husband who was with her on the phone noticed her voice sounded different, she was wheezing. She felt her skin was on fire like she was bitten by ants. Tx; steroid shot and benadryl. Where she touched the pepper her skin was also severely red and irritated. Rest of that week she had issue eating hotdog with paprika and felt scratchiness in the back of her throat. Immediately gave herself benadryl. Has been avoiding pepper since. She loves [place] foods. She loves chili peppers, bell peppers and paprika. She also loves chili powder.  Asthma:  She did have childhood asthma, but has no current inhalers.  Last used an inhaler after running after a dog and couldn't breath but this was many years ago. Not getting systemic steroids due to breathing issues. No prior hospitalizations due to breathing episodes.  She does get SOB regularly. She is seeing a cardiologist.  Multiple times per day will feel SOB, heat and exercise trigger symptoms. Lasts 3-10 minutes, bending herself over or putting her head in a freezer seems to help. Sometimes will force herself to cough it out. She is a chronic smoker.  Smokes 1/2 pack a day of mentholated cigarettes No recent CXR or CBCd available for review.  Chronic rhinitis:  When teenager, she had an allergy testing and was told she was  allergic to many things.  She is not on allergy meds. Rash from grass. Described as itchy. Gets watery nose, watery, itchy eyes. Gets frequent sinus infections in the fall.   Gets rash from different detergents and soaps.  She does have rash on her hands mostly, in winter will get it on her elbows thighs and sometimes tops of feet.  She does not have any topical steroids to use if this happens.   Fire ants-stepped in a nest and entire legs became swollen. She does get blisters from ant bites.  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 Hymenoptera allergy: no Vaccinations are up to date.   Past Medical History: Past Medical History:  Diagnosis Date   Abnormal electrocardiogram    Anxiety 2014   PTSD   Asthma    Bipolar II disorder (HCC) 12/24/2020   Dental cavities    very poor dental hygeine   Depression    Missed abortion 11/03/2014   Palpitations    Shortness of breath    Smoker 12/02/2018   Vaginal delivery 2006, 2011   Medication List:  Current Outpatient Medications  Medication Sig Dispense Refill   albuterol (VENTOLIN HFA) 108 (90 Base) MCG/ACT inhaler Inhale 2 puffs into the lungs every 6 (six) hours as needed for wheezing or shortness of breath. 8 g 2   cetirizine (ZYRTEC ALLERGY) 10 MG tablet Take 1 tablet (10 mg total) by  mouth daily. 32 tablet 5   diphenhydrAMINE (BENADRYL) 12.5 MG/5ML elixir Take 10-20 mLs by mouth 4 (four) times daily as needed for itching or allergies.     EPINEPHrine (EPIPEN JR) 0.15 MG/0.3ML injection Inject 0.15 mg into the muscle as needed for anaphylaxis.     Etonogestrel (NEXPLANON [place]) Inject 1 each into the skin daily. Will be removed on 05/06/2023     fluticasone (FLONASE) 50 MCG/ACT nasal spray Place 1 spray into both nostrils daily. 16 g 5   hydrocortisone 2.5 % ointment  Apply topically twice daily as need to red sandpapery rash. 30 g 2   hydrOXYzine (ATARAX) 25 MG tablet Take 25 mg by mouth 2 (two) times daily.     Levonorgestrel (MIRENA, 52 MG, IU) 1 each by Intrauterine route daily.     metoprolol tartrate (LOPRESSOR) 25 MG tablet Take 1 tablet (25 mg total) by mouth 2 (two) times daily. 180 tablet 3   Olopatadine HCl (PATADAY) 0.2 % SOLN Place 1 drop into both eyes daily as needed. 2.5 mL 5   sertraline (ZOLOFT) 50 MG tablet Take 50 mg by mouth daily.     triamcinolone ointment (KENALOG) 0.1 % Apply topically twice daily to BODY as needed for red, sandpaper like rash.  Do not use on face, groin or armpits. 453 g 1   Vitamin D, Ergocalciferol, (DRISDOL) 1.25 MG (50000 UNIT) CAPS capsule Take 50,000 Units by mouth every 7 (seven) days. Last week     No current facility-administered medications for this visit.   Known Allergies:  Allergies  Allergen Reactions   Casein Anaphylaxis   Aspirin Nausea And Vomiting   Latex     Rash,itchness in genital area.   Past Surgical History: Past Surgical History:  Procedure Laterality Date   CESAREAN SECTION  2010   DILATION AND EVACUATION N/A 11/03/2014   Procedure: DILATATION AND EVACUATION;  Surgeon: Adam Phenix, MD;  Location: [facility];  Service: Gynecology;  Laterality: N/A;   Family History: Family History  Problem Relation Age of Onset   Diabetes Mother    Heart disease Mother    Muscular dystrophy Father    Social History: Samaya lives in a house without water damage, carpet in bedroom, electric heating, central AC and windows, indoor cat, outdoor dog and chickens, + roaches in the home, not using does not protection on the bedding or pillows, she is exposed to smoke in the home in the car.  She smokes 0.5 PPD of menthol cigarettes.  Works as a Arboriculturist.  No HEPA filter in the home.  [place]   ROS:  All other systems negative except as noted per HPI.  Objective:   Blood pressure 110/78, pulse 84, temperature 98.6 F (37 C), temperature source Temporal, resp. rate 18, height 5\' 3"  (1.6 m), weight 105 lb 1.6 oz (47.7 kg), SpO2 96%, unknown if currently breastfeeding. Body mass index is 18.62 kg/m. Physical Exam:  General Appearance:  Alert, cooperative, no distress, appears stated age  Head:  Normocephalic, without obvious abnormality, atraumatic  Eyes:  Conjunctiva clear, EOM's intact  Ears EACs normal bilaterally and normal TMs bilaterally  Nose: Nares normal, hypertrophic turbinates, normal mucosa, and no visible anterior polyps  Throat: Lips, tongue normal; teeth and gums normal, normal posterior oropharynx  Neck: Supple, symmetrical  Lungs:   clear to auscultation bilaterally, Respirations unlabored, no coughing  Heart:  regular rate and rhythm and no murmur, Appears well perfused  Extremities: No edema  Skin: Skin color, texture, turgor normal and no rashes or lesions on visualized portions of skin  Neurologic: No gross deficits   Diagnostics: Spirometry:  Tracings reviewed. Her effort: Good reproducible efforts. FVC: 2.49L (pre), 2.59L  (post) FEV1: 1.80L, 60% predicted (pre), 2.01L, 67% predicted (post) FEV1/FVC ratio: 0.72 (pre), 0.78 (post) Interpretation: Nonobstructive ratio, low FEV1, possible restriction.  With significant improvement in FEV1 of 12% following post bronchodilator Please see scanned spirometry results for details.  Skin Testing: Environmental allergy panel and select foods. Adequate positive and negative controls. Results discussed with patient/family.  Airborne Adult Perc - 04/23/23 1424     Time Antigen Placed 1420    Allergen Manufacturer Waynette Buttery    Location Back    Number of Test 55    1. Control-Buffer 50% Glycerol Negative    2. Control-Histamine 3+    3. Bahia Negative    4. French [place] Negative    5. Johnson Negative    6. Kentucky Blue Negative    7. Meadow Fescue Negative    8. Perennial Rye Negative     9. Timothy Negative    10. Ragweed Mix Negative    11. Cocklebur Negative    12. Plantain,  English Negative    13. Baccharis Negative    14. Dog Fennel Negative    15. Russian Thistle Negative    16. Lamb's Quarters Negative    17. Sheep Sorrell Negative    18. Rough Pigweed Negative    19. Marsh Elder, Rough Negative    20. Mugwort, Common Negative    21. Box, Elder 3+    22. Cedar, red Negative    23. Sweet Gum Negative    24. Pecan Pollen Negative    25. Pine Mix Negative    26. Walnut, Black Pollen Negative    27. Red Mulberry Negative    28. Ash Mix Negative    29. Birch Mix Negative    30. Beech American Negative    31. Cottonwood, [place] Negative    32. Hickory, White Negative    33. Maple Mix Negative    34. Oak, [place] Mix Negative    35. Sycamore Eastern Negative    36. Alternaria Alternata Negative    37. Cladosporium Herbarum Negative    38. Aspergillus Mix Negative    39. Penicillium Mix Negative    40. Bipolaris Sorokiniana (Helminthosporium) Negative    41. Drechslera Spicifera (Curvularia) Negative    42. Mucor Plumbeus Negative    43. Fusarium Moniliforme Negative    44. Aureobasidium Pullulans (pullulara) Negative    45. Rhizopus Oryzae Negative    46. Botrytis Cinera Negative    47. Epicoccum Nigrum Negative    48. Phoma Betae Negative    49. Dust Mite Mix 4+    50. Cat Hair 10,000 BAU/ml 2+    51.  Dog Epithelia Negative    52. Mixed Feathers Negative    53. Horse Epithelia Negative    54. Cockroach, German Negative    55. Tobacco Leaf Negative             Food Adult Perc - 04/23/23 1400     Time Antigen Placed 1420    Allergen Manufacturer Waynette Buttery    Location Back    Number of allergen test 1    45. Green Pepper Negative             Allergy testing results were read and interpreted by myself, documented by clinical staff.  Labs:  Lab Orders         Allergen,Chili Pepper,[id number]         Allergen,Grn Pepper,Paprika,[id number]          Tryptase       Assessment and Plan  Food allergy vs adverse reaction/irritant reaction to capsaicin - today's skin testing was negative to green peppers - labs today green pepper, paprika, chili pepper - please strictly avoid peppers for now, pending lab work may consider challenges if indicated-will try to reintroduce the peppers that she normally eats including green peppers, paprika and chili powder/pepper - for SKIN only reaction, okay to take Benadryl 2 capsules every 4 hours - for SKIN + ANY additional symptoms, OR IF concern for LIFE THREATENING reaction = Epipen Autoinjector EpiPen 0.3 mg. - If using Epinephrine autoinjector, call 911 - A food allergy action plan has been provided and discussed. - Medic Alert identification is recommended.  Chronic Rhinitis -seasonal and perennial allergic: - allergy testing today was positive to box elder tree pollen, dust mites and cat - allergen avoidance as below - Start Zyrtec (cetirizine) 10 mg  daily as needed. - Consider nasal saline rinses as needed to help remove pollens, mucus and hydrate nasal mucosa - Start Flonase (fluticasone) 1 spray in each nostril daily  Best results if used daily.  Discontinue if recurrent nose bleeds. - consider allergy shots as long term control of your symptoms by teaching your immune system to be more tolerant of your allergy triggers  Allergic Conjunctivitis:  - Start Pataday eye drops-1 drop each eye daily as needed  Shortness of breath: - your lung testing today showed restriction, but did show significant improvement in FEV1 following bronchodilator which is suggestive of hyperresponsive airways/asthma  - Trial of Rescue Inhaler: Albuterol (Proair/Ventolin) 2 puffs . Use  every 4-6 hours as needed for chest tightness, wheezing, or coughing.   Can also use 15 minutes prior to exercise if you have symptoms with activity. - symptoms are not controlled if:  - Symptoms are occurring >2 times a week  OR  - >2 times a month nighttime awakenings  - You are requiring systemic steroids (prednisone/steroid injections) more than once per year  - Your require hospitalization for your asthma.  - Please call the clinic to schedule a follow up if these symptoms arise Avoid smoke exposure Stay up-to-date with your annual flu vaccines, COVID vaccines and pneumonia vaccines when indicated.  Atopic Dermatitis:  Daily Care For Maintenance (daily and continue even once eczema controlled) - Use hypoallergenic hydrating ointment at least twice daily.  This must be done daily for control of flares. (Great options include Vaseline, CeraVe, Aquaphor, Aveeno, Cetaphil, VaniCream, etc) - Avoid detergents, soaps or lotions with fragrances/dyes - Limit showers/baths to 5 minutes and use luke warm water instead of hot, pat dry following baths, and apply moisturizer - can use steroid/non-steroid therapy creams as detailed below up to twice weekly for prevention of flares.  For Flares:(add this to maintenance therapy if needed for flares) First apply steroid/non-steroid treatment creams. Wait 5 minutes then apply moisturizer.  - Triamcinolone 0.1% to body for moderate flares-apply topically twice daily to red, raised areas of skin, followed by moisturizer. Do NOT use on face, groin or armpits. - Hydrocortisone 2.5% to face/body-apply topically twice daily to red, raised areas of skin, followed by moisturizer  Consider patch testing if continues having rashes with different soaps or detergents. Use only fragrance and dye free products.   Follow up : 3 months,  sooner if needed It was a pleasure meeting you in clinic today! Thank you for allowing me to participate in your care.  This note in its entirety was forwarded to the Provider who requested this consultation.  Other: none  Thank you for your kind referral. I appreciate the opportunity to take part in Dove's care. Please do not hesitate to contact me with  questions.  Sincerely,  Tonny Bollman, MD [facility]

## 2023-04-22 ENCOUNTER — Telehealth: Payer: Self-pay

## 2023-04-22 ENCOUNTER — Ambulatory Visit: Payer: Medicaid Other

## 2023-04-22 VITALS — BP 90/62 | HR 72 | Ht 63.0 in | Wt 110.0 lb

## 2023-04-22 DIAGNOSIS — F17200 Nicotine dependence, unspecified, uncomplicated: Secondary | ICD-10-CM

## 2023-04-22 DIAGNOSIS — R002 Palpitations: Secondary | ICD-10-CM | POA: Diagnosis not present

## 2023-04-22 DIAGNOSIS — R0609 Other forms of dyspnea: Secondary | ICD-10-CM

## 2023-04-22 DIAGNOSIS — R079 Chest pain, unspecified: Secondary | ICD-10-CM | POA: Diagnosis not present

## 2023-04-22 DIAGNOSIS — I471 Supraventricular tachycardia, unspecified: Secondary | ICD-10-CM

## 2023-04-22 DIAGNOSIS — J45909 Unspecified asthma, uncomplicated: Secondary | ICD-10-CM

## 2023-04-22 DIAGNOSIS — R072 Precordial pain: Secondary | ICD-10-CM

## 2023-04-22 MED ORDER — METOPROLOL TARTRATE 50 MG PO TABS: 50.0000 mg | ORAL_TABLET | Freq: Once | ORAL | 0 refills | Status: DC

## 2023-04-22 MED ORDER — METOPROLOL TARTRATE 25 MG PO TABS: 25.0000 mg | ORAL_TABLET | Freq: Two times a day (BID) | ORAL | 3 refills | Status: DC

## 2023-04-22 NOTE — Assessment & Plan Note (Signed)
Palpitations seem related to elevated heart rates which occur randomly. Prior event monitor mostly benign in March 2022.  Repeat event monitor pending at this time will review results once available. Started metoprolol tartrate 25 mg twice daily as above.

## 2023-04-22 NOTE — Progress Notes (Addendum)
Cardiology Office Note:    Date:  04/22/2023   ID:  Kaiyah Dimatteo, DOB 18-Apr-1986, MRN [id number]  PCP:  [facility]  Cardiologist:  Marlyn Corporal Jancie Kercher, MD    Referring MD: Sherlie Ban, NP   History of palpitations and dizziness  History of Present Illness:    Dalicia Shircliff is a 37 y.o. female here for follow-up visit for paroxysmal SVT.  Last visit at our office was with Dr. Servando Salina July 2022.  History of palpitations, short runs of SVT on 7-day event monitor in March 2022, smoker, asthma, bipolar disorder, PTSD, anxiety, severe allergies to various food substances, severe allergy to aspirin with nausea and vomiting.  Here for the visit today by herself.  Mentions she lives at home with her husband who works extended hours.  She has 4 children, 3 of whom live with her at home she mentions 2 kids have autism.  She describes her house situation is extremely stressful.  She has multiple concerns going on at this time. From cardiac standpoint she describes chest pain that comes on and off, random, feels like a pressure and sharp sensation retrosternal, lasting for few seconds to minutes and eases gradually over time.  Associated with a sensation of shortness of breath, lightheadedness at times.  Even in the clinic today she described an episode that lasted for couple minutes before it started easing up.  There were no noticeable changes in her hemodynamics, heart rate.  She describes episodes of palpitations where she feels her heart rate speeds up randomly without any trigger.  Lasts for few seconds.  No obvious relieving factors, she sits down or lays down and lets the sensation pass.  Describes a sensation of shortness of breath which has been chronic, worse with exertion.  Relieved with rest.  Does continue to smoke a pack of cigarettes a day and recently has cut back to about half a pack over the last few days.  Mentions she is aware of harmful effects but unable to  quit given her stressors at home.  She had a event monitor on for 2 weeks, returned at about a week ago and has not received the results yet.  EKG in the clinic today shows sinus rhythm heart rate 72/min, normal PR interval 126 ms, QRS duration 72 ms.  She mentions trying propranolol as prescribed at her prior cardiology visit, describes a sensation of feeling tired with higher doses of propranolol and hence she quit taking the medication.  Pulmonary function test that were recommended at previous visit, mentions did not occur as she was going to stressful situations at home and was unable to get them done.  Recently with exposure to papers in her garden she had an episode of her throat swelling and difficulty breathing, this was treated at an urgent care setting with Benadryl and steroids.  She is hesitant to take epinephrine given her concern and fear about her underlying cardiac issues.  Last lipid panel labs from PCP notes noted total cholesterol 175, triglycerides 92, LDL 111, HDL 46 BUN 5, creatinine 0.65, sodium 141, potassium 4.1, normal transaminases Normal TSH 1.86 Hemoglobin 13.9, hematocrit 42.1, platelets 258 Hemoglobin A1c normal at 5.2  Event monitor results from 11-27-2020 scanned copy reviewed shows average heart rate 85/min with heart rate ranging from 55 bpm to 169 bpm.  Total supraventricular ectopy burden rare with total count 8 in 24 hours. Longest run of SVT noted to be 3 beats duration on day 2. No  PVCs. Tracing of the maximum heart rate 169 bpm from 11-30-2020 at 1119.  Does not show the onset and offset, could be SVT versus sinus tachycardia.  Past Medical History:  Diagnosis Date   Abnormal electrocardiogram    Anxiety 2014   PTSD   Asthma    Bipolar II disorder (HCC) 12/24/2020   Dental cavities    very poor dental hygeine   Depression    Missed abortion 11/03/2014   Palpitations    Shortness of breath    Smoker 12/02/2018   Vaginal delivery 2006, 2011     Past Surgical History:  Procedure Laterality Date   CESAREAN SECTION  2010   DILATION AND EVACUATION N/A 11/03/2014   Procedure: DILATATION AND EVACUATION;  Surgeon: Adam Phenix, MD;  Location: [facility];  Service: Gynecology;  Laterality: N/A;    Current Medications: Current Meds  Medication Sig   diphenhydrAMINE (BENADRYL) 12.5 MG/5ML elixir Take 10-20 mLs by mouth 4 (four) times daily as needed for itching or allergies.   EPINEPHrine (EPIPEN JR) 0.15 MG/0.3ML injection Inject 0.15 mg into the muscle as needed for anaphylaxis.   Etonogestrel (NEXPLANON [place]) Inject 1 each into the skin daily. Will be removed on 05/06/2023   hydrOXYzine (ATARAX) 25 MG tablet Take 25 mg by mouth 2 (two) times daily.   Levonorgestrel (MIRENA, 52 MG, IU) 1 each by Intrauterine route daily.   metoprolol tartrate (LOPRESSOR) 25 MG tablet Take 1 tablet (25 mg total) by mouth 2 (two) times daily.   metoprolol tartrate (LOPRESSOR) 50 MG tablet Take 1 tablet (50 mg total) by mouth once for 1 dose. Please take this medication 2 hours before CT   sertraline (ZOLOFT) 50 MG tablet Take 50 mg by mouth daily.   Vitamin D, Ergocalciferol, (DRISDOL) 1.25 MG (50000 UNIT) CAPS capsule Take 50,000 Units by mouth every 7 (seven) days. Last week     Allergies:   Casein, Aspirin, and Latex   Social History   Socioeconomic History   Marital status: Married    Spouse name: Not on file   Number of children: Not on file   Years of education: Not on file   Highest education level: Not on file  Occupational History   Not on file  Tobacco Use   Smoking status: Every Day    Current packs/day: 1.00    Average packs/day: 1 pack/day for 15.0 years (15.0 ttl pk-yrs)    Types: Cigarettes   Smokeless tobacco: Never  Substance and Sexual Activity   Alcohol use: No   Drug use: No   Sexual activity: Not Currently    Birth control/protection: None  Other Topics Concern   Not on file  Social History Narrative   Not on file    Social Determinants of Health   Financial Resource Strain: Not on file  Food Insecurity: Not on file  Transportation Needs: Not on file  Physical Activity: Not on file  Stress: Not on file  Social Connections: Not on file     Family History: The patient's family history includes Diabetes in her mother; Heart disease in her mother; Muscular dystrophy in her father. ROS:   Please see the history of present illness.    All 14 point review of systems negative except as described per history of present illness  EKGs/Labs/Other Studies Reviewed:    EKG Interpretation Date/Time:  Wednesday April 22 2023 08:53:02 EDT Ventricular Rate:  72 PR Interval:  126 QRS Duration:  72 QT Interval:  364  QTC Calculation: 398 R Axis:   88  Text Interpretation: Normal sinus rhythm Possible Left atrial enlargement Borderline ECG No previous ECGs available Confirmed by Huntley Dec reddy [id number]) on 04/22/2023 9:08:39 AM    Recent Labs: No results found for requested labs within last 365 days.  Recent Lipid Panel No results found for: "CHOL", "TRIG", "HDL", "CHOLHDL", "VLDL", "LDLCALC", "LDLDIRECT"  Physical Exam:    VS:  BP 90/62 (BP Location: Left Arm, Patient Position: Sitting)   Pulse 72   Ht 5\' 3"  (1.6 m)   Wt 110 lb (49.9 kg)   SpO2 100%   BMI 19.49 kg/m     Wt Readings from Last 3 Encounters:  04/22/23 110 lb (49.9 kg)  03/01/21 110 lb 6.4 oz (50.1 kg)  01/11/21 110 lb 3.2 oz (50 kg)     GENERAL:  Well nourished, well developed in no acute distress NECK: No JVD; No carotid bruits CARDIAC: RRR, S1 and S2 present, no murmurs, no rubs, no gallops CHEST:  Clear to auscultation without rales, wheezing or rhonchi  Extremities: No pitting pedal edema. Pulses bilaterally symmetric with radial 2+ and dorsalis pedis 2+ NEUROLOGIC:  Alert and oriented x 3   ASSESSMENT AND PLAN:    Problem List Items Addressed This Visit     Smoker    Discussed harmful effects of  smoking. Suggested to quit smoking entirely as this could be contributing both to her breathing issues and chest pain symptoms. She admits this has been very difficult she is willing to cut down.      Relevant Orders   Basic Metabolic Panel (BMET)   Pulmonary function test   Asthma    She does report childhood asthma. She continues to smoke which could be triggering her reactive airway disease 2. Would recommend assessing her pulmonary function test.       Relevant Orders   Basic Metabolic Panel (BMET)   Pulmonary function test   PSVT (paroxysmal supraventricular tachycardia)    Last event monitor March 2022, heart rate 160s around 11 PM in the night, could be sinus tachycardia versus an SVT. Repeat event monitor ordered recently, results not available yet.  Will request PCPs office to forward these results once they become available.  Discussed with her potential triggers for SVT episodes. Advised to keep herself well-hydrated.  At onset of episodes advised her to sit down or lay down and have her feet elevated.    Will start low-dose metoprolol tartrate 25 mg twice daily which should help with any underlying SVT, inappropriate sinus tachycardia, vasospasm. Discussed potential risk benefits. Alternative calcium channel blocker such as diltiazem discussed, however her somewhat low blood pressure limits the use of this.  Addendum: Event monitor results now made available this afternoon. Event monitor 13 days study from 03/27/2023 scan results available without a finalized report. Reviewed the tracings myself. Average heart rate 76/min, lowest heart rate 51 and highest heart rate 143 in sinus rhythm. Total PAC burden less than 1%, total PVC burden 1%. 41 events were transmitted of which 5 were auto triggered. No sustained arrhythmias, no atrial fibrillation or SVT noted. No pauses. No heart blocks. Auto triggered and manually transmitted tracings correlated with either sinus  rhythm with normal heart rate or sinus tachycardia. Essentially benign study.  Based on these results of the event monitor number available, no change in plan as noted above, continue with metoprolol tartrate 25 mg daily. Will review CTA coronary angiogram results once available.  Relevant Medications   EPINEPHrine (EPIPEN JR) 0.15 MG/0.3ML injection   metoprolol tartrate (LOPRESSOR) 25 MG tablet   metoprolol tartrate (LOPRESSOR) 50 MG tablet   Other Relevant Orders   Basic Metabolic Panel (BMET)   Pulmonary function test   Dyspnea on exertion    This could be multifactorial in the setting of anxiety, underlying asthma, ongoing smoking, and possibility of angina with atherosclerosis versus vasospasm.  Will assess for cardiac structure, function, coronary artery disease with echocardiogram and CT coronary angiogram as reviewed above.        Relevant Orders   Basic Metabolic Panel (BMET)   Pulmonary function test   Palpitations - Primary    Palpitations seem related to elevated heart rates which occur randomly. Prior event monitor mostly benign in March 2022.  Repeat event monitor pending at this time will review results once available. Started metoprolol tartrate 25 mg twice daily as above.       Relevant Orders   EKG 12-Lead (Completed)   ECHOCARDIOGRAM COMPLETE   Basic Metabolic Panel (BMET)   Pulmonary function test   Chest pain of uncertain etiology    Chest pain could be cardiac origin either tachycardia related versus angina in the setting of vasospasm versus obstructive coronary artery disease given her cardiovascular risk factors.  Will proceed with transthoracic echocardiogram to assess for any significant structural and functional changes since her last echo.  Will obtain a CTA coronary angiogram to assess for atherosclerosis and any obstructive CAD. She wishes to get this done at [facility] given her belief that there is better cardiology coverage  over there than at the local facility. metoprolol she will take tartrate 50 mg on the morning of the study.  She is allergic to aspirin. If she does not have any atherosclerotic disease, will consider starting Plavix. Alternately she can will require aspirin desensitization and referral to a specialist in immunology and allergy.  This case with her significant vasospasm that can occur in women with anxiety and smoking which could be the reason for her symptoms. In the setting suggested to quit smoking. Advised her to practice relaxation and breathing techniques to deal with anxiety.   Return to clinic in 3 months.  Will review the results as available and we will bring her back for follow-up earlier if needed.       Relevant Orders   Basic Metabolic Panel (BMET)   Pulmonary function test   Other Visit Diagnoses     Precordial pain       Relevant Orders   ECHOCARDIOGRAM COMPLETE   CT CORONARY MORPH W/CTA COR W/SCORE W/CA W/CM &/OR WO/CM   Basic Metabolic Panel (BMET)   Pulmonary function test        Medication Adjustments/Labs and Tests Ordered: Current medicines are reviewed at length with the patient today.  Concerns regarding medicines are outlined above.  Orders Placed This Encounter  Procedures   CT CORONARY MORPH W/CTA COR W/SCORE W/CA W/CM &/OR WO/CM   Basic Metabolic Panel (BMET)   EKG 12-Lead   ECHOCARDIOGRAM COMPLETE   Pulmonary function test   Medication changes:  Meds ordered this encounter  Medications   metoprolol tartrate (LOPRESSOR) 25 MG tablet    Sig: Take 1 tablet (25 mg total) by mouth 2 (two) times daily.    Dispense:  180 tablet    Refill:  3   metoprolol tartrate (LOPRESSOR) 50 MG tablet    Sig: Take 1 tablet (50 mg total) by mouth once  for 1 dose. Please take this medication 2 hours before CT    Dispense:  1 tablet    Refill:  0    Signed, Aleasha Fregeau reddy Artavius Stearns, MD, MPH, [facility] 04/22/2023 12:08 PM    [facility]  HeartCare

## 2023-04-22 NOTE — Assessment & Plan Note (Signed)
Discussed harmful effects of smoking. Suggested to quit smoking entirely as this could be contributing both to her breathing issues and chest pain symptoms. She admits this has been very difficult she is willing to cut down.

## 2023-04-22 NOTE — Patient Instructions (Signed)
Medication Instructions:  Your physician has recommended you make the following change in your medication:   START: Metoprolol tartrate 25 mg twice daily  *If you need a refill on your cardiac medications before your next appointment, please call your pharmacy*   Lab Work: Your physician recommends that you return for lab work in:   Labs 1 week before CT: BMP  If you have labs (blood work) drawn today and your tests are completely normal, you will receive your results only by: MyChart Message (if you have MyChart) OR A paper copy in the mail If you have any lab test that is abnormal or we need to change your treatment, we will call you to review the results.   Testing/Procedures: Your physician has requested that you have an echocardiogram. Echocardiography is a painless test that uses sound waves to create images of your heart. It provides your doctor with information about the size and shape of your heart and how well your heart's chambers and valves are working. This procedure takes approximately one hour. There are no restrictions for this procedure. Please do NOT wear cologne, perfume, aftershave, or lotions (deodorant is allowed). Please arrive 15 minutes prior to your appointment time.    Your cardiac CT will be scheduled at one of the below locations:   [facility] [address] [place], [place] [address] [phone]  OR  [facility] [address] [place], [place] [address] [phone]  OR   [facility] [address] [place], [place] [address] [phone]  If scheduled at [facility], please arrive at the [facility] and Children's Entrance (Entrance C2) of [facility] 30 minutes prior to test start time. You can use the FREE valet parking offered at entrance C (encouraged to control the heart rate for the test)  Proceed to the [facility] Radiology Department  (first floor) to check-in and test prep.  All radiology patients and guests should use entrance C2 at [facility], accessed from [facility], even though the hospital's physical address listed is [address]    If scheduled at [facility] or [facility], please arrive 15 mins early for check-in and test prep.  There is spacious parking and easy access to the radiology department from the [facility] Heart and Vascular entrance. Please enter here and check-in with the desk attendant.   Please follow these instructions carefully (unless otherwise directed):  An IV will be required for this test and Nitroglycerin will be given.  Hold all erectile dysfunction medications at least 3 days (72 hrs) prior to test. (Ie viagra, cialis, sildenafil, tadalafil, etc)   On the Night Before the Test: Be sure to Drink plenty of water. Do not consume any caffeinated/decaffeinated beverages or chocolate 12 hours prior to your test. Do not take any antihistamines 12 hours prior to your test.  On the Day of the Test: Drink plenty of water until 1 hour prior to the test. Do not eat any food 1 hour prior to test. You may take your regular medications prior to the test.  Take metoprolol (Lopressor) two hours prior to test. FEMALES- please wear underwire-free bra if available, avoid dresses & tight clothing      After the Test: Drink plenty of water. After receiving IV contrast, you may experience a mild flushed feeling. This is normal. On occasion, you may experience a mild rash up to  24 hours after the test. This is not dangerous. If this occurs, you can take Benadryl 25 mg and increase your fluid intake. If you experience trouble breathing, this can be serious. If it is severe call 911 IMMEDIATELY. If it is mild, please call our office.  We will call to schedule your test 2-4 weeks out understanding that some insurance companies will  need an authorization prior to the service being performed.   For more information and frequently asked questions, please visit our website : [email or web address]  For non-scheduling related questions, please contact the cardiac imaging nurse navigator should you have any questions/concerns: Cardiac Imaging Nurse Navigators Direct Office Dial: [phone]   For scheduling needs, including cancellations and rescheduling, please call [place], [phone].    Follow-Up: At [facility], you and your health needs are our priority.  As part of our continuing mission to provide you with exceptional heart care, we have created designated Provider Care Teams.  These Care Teams include your primary Cardiologist (physician) and Advanced Practice Providers (APPs -  Physician Assistants and Nurse Practitioners) who all work together to provide you with the care you need, when you need it.  We recommend signing up for the patient portal called "MyChart".  Sign up information is provided on this After Visit Summary.  MyChart is used to connect with patients for Virtual Visits (Telemedicine).  Patients are able to view lab/test results, encounter notes, upcoming appointments, etc.  Non-urgent messages can be sent to your provider as well.   To learn more about what you can do with MyChart, go to [email or web address].    Your next appointment:   3 month(s)  Provider:   Dr. Vincent Gros  Other Instructions None

## 2023-04-22 NOTE — Assessment & Plan Note (Addendum)
Last event monitor March 2022, heart rate 160s around 11 PM in the night, could be sinus tachycardia versus an SVT. Repeat event monitor ordered recently, results not available yet.  Will request PCPs office to forward these results once they become available.  Discussed with her potential triggers for SVT episodes. Advised to keep herself well-hydrated.  At onset of episodes advised her to sit down or lay down and have her feet elevated.    Will start low-dose metoprolol tartrate 25 mg twice daily which should help with any underlying SVT, inappropriate sinus tachycardia, vasospasm. Discussed potential risk benefits. Alternative calcium channel blocker such as diltiazem discussed, however her somewhat low blood pressure limits the use of this.  Addendum: Event monitor results now made available this afternoon. Event monitor 13 days study from 03/27/2023 scan results available without a finalized report. Reviewed the tracings myself. Average heart rate 76/min, lowest heart rate 51 and highest heart rate 143 in sinus rhythm. Total PAC burden less than 1%, total PVC burden 1%. 41 events were transmitted of which 5 were auto triggered. No sustained arrhythmias, no atrial fibrillation or SVT noted. No pauses. No heart blocks. Auto triggered and manually transmitted tracings correlated with either sinus rhythm with normal heart rate or sinus tachycardia. Essentially benign study.  Based on these results of the event monitor number available, no change in plan as noted above, continue with metoprolol tartrate 25 mg daily. Will review CTA coronary angiogram results once available.

## 2023-04-22 NOTE — Telephone Encounter (Signed)
Called patient and informed her of the pulmonary function test that Dr. Vincent Gros ordered for her after her office visit. Patient was agreeable with having the test and she had no further questions at this time.

## 2023-04-22 NOTE — Assessment & Plan Note (Addendum)
Chest pain could be cardiac origin either tachycardia related versus angina in the setting of vasospasm versus obstructive coronary artery disease given her cardiovascular risk factors.  Will proceed with transthoracic echocardiogram to assess for any significant structural and functional changes since her last echo.  Will obtain a CTA coronary angiogram to assess for atherosclerosis and any obstructive CAD. She wishes to get this done at [facility] given her belief that there is better cardiology coverage over there than at the local facility. metoprolol she will take tartrate 50 mg on the morning of the study.  She is allergic to aspirin. If she does not have any atherosclerotic disease, will consider starting Plavix. Alternately she can will require aspirin desensitization and referral to a specialist in immunology and allergy.  This case with her significant vasospasm that can occur in women with anxiety and smoking which could be the reason for her symptoms. In the setting suggested to quit smoking. Advised her to practice relaxation and breathing techniques to deal with anxiety.   Return to clinic in 3 months.  Will review the results as available and we will bring her back for follow-up earlier if needed.

## 2023-04-22 NOTE — Telephone Encounter (Signed)
[facility] and requested the patient's heart monitor results. They stated that they would fax the results to our office.

## 2023-04-22 NOTE — Assessment & Plan Note (Signed)
She does report childhood asthma. She continues to smoke which could be triggering her reactive airway disease 2. Would recommend assessing her pulmonary function test.

## 2023-04-22 NOTE — Assessment & Plan Note (Signed)
This could be multifactorial in the setting of anxiety, underlying asthma, ongoing smoking, and possibility of angina with atherosclerosis versus vasospasm.  Will assess for cardiac structure, function, coronary artery disease with echocardiogram and CT coronary angiogram as reviewed above.

## 2023-04-23 ENCOUNTER — Ambulatory Visit (INDEPENDENT_AMBULATORY_CARE_PROVIDER_SITE_OTHER): Payer: Medicaid Other | Admitting: Internal Medicine

## 2023-04-23 ENCOUNTER — Encounter: Payer: Self-pay | Admitting: Internal Medicine

## 2023-04-23 VITALS — BP 110/78 | HR 84 | Temp 98.6°F | Resp 18 | Ht 63.0 in | Wt 105.1 lb

## 2023-04-23 DIAGNOSIS — T781XXA Other adverse food reactions, not elsewhere classified, initial encounter: Secondary | ICD-10-CM | POA: Diagnosis not present

## 2023-04-23 DIAGNOSIS — J302 Other seasonal allergic rhinitis: Secondary | ICD-10-CM | POA: Insufficient documentation

## 2023-04-23 DIAGNOSIS — J3089 Other allergic rhinitis: Secondary | ICD-10-CM | POA: Diagnosis not present

## 2023-04-23 DIAGNOSIS — J4520 Mild intermittent asthma, uncomplicated: Secondary | ICD-10-CM | POA: Diagnosis not present

## 2023-04-23 DIAGNOSIS — J310 Chronic rhinitis: Secondary | ICD-10-CM

## 2023-04-23 DIAGNOSIS — R0609 Other forms of dyspnea: Secondary | ICD-10-CM

## 2023-04-23 MED ORDER — ALBUTEROL SULFATE HFA 108 (90 BASE) MCG/ACT IN AERS: 2.0000 | INHALATION_SPRAY | Freq: Four times a day (QID) | RESPIRATORY_TRACT | 2 refills | Status: DC | PRN

## 2023-04-23 MED ORDER — TRIAMCINOLONE ACETONIDE 0.1 % EX OINT: TOPICAL_OINTMENT | CUTANEOUS | 1 refills | Status: DC

## 2023-04-23 MED ORDER — FLUTICASONE PROPIONATE 50 MCG/ACT NA SUSP: 1.0000 | Freq: Every day | NASAL | 5 refills | Status: DC

## 2023-04-23 MED ORDER — CETIRIZINE HCL 10 MG PO TABS: 10.0000 mg | ORAL_TABLET | Freq: Every day | ORAL | 5 refills | Status: DC

## 2023-04-23 MED ORDER — HYDROCORTISONE 2.5 % EX OINT: TOPICAL_OINTMENT | CUTANEOUS | 2 refills | Status: DC

## 2023-04-23 MED ORDER — OLOPATADINE HCL 0.2 % OP SOLN: 1.0000 [drp] | Freq: Every day | OPHTHALMIC | 5 refills | Status: DC | PRN

## 2023-04-23 NOTE — Patient Instructions (Addendum)
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 - If using Epinephrine autoinjector, call 911 - A food allergy action plan has been provided and discussed. - Medic Alert identification is recommended.  Chronic Rhinitis -seasonal and perennial allergic: - allergy testing today was positive to box elder tree pollen, dust mites and cat - allergen avoidance as below - Start Zyrtec (cetirizine)  10 mg   daily as needed. - Consider nasal saline rinses as needed to help remove pollens, mucus and hydrate nasal mucosa - Start Flonase (fluticasone) 1 spray in each nostril daily  Best results if used daily.  Discontinue if recurrent nose bleeds. - consider allergy shots as long term control of your symptoms by teaching your immune system to be more tolerant of your allergy triggers  Allergic Conjunctivitis:  - Start Pataday eye drops-1 drop each eye daily as needed  Shortness of breath: - your lung testing today showed restriction, but did show significant improvement in FEV1 following bronchodilator which is suggestive of hyperresponsive airways/asthma  - Trial of Rescue Inhaler: Albuterol (Proair/Ventolin) 2 puffs . Use  every 4-6 hours as needed for chest tightness, wheezing, or coughing.   Can also use 15 minutes prior to exercise if you have symptoms with activity. - symptoms are not controlled if:  - Symptoms are occurring >2 times a week OR  - >2 times a month nighttime awakenings  - You are requiring systemic steroids (prednisone/steroid injections) more than once per year  - Your require  hospitalization for your asthma.  - Please call the clinic to schedule a follow up if these symptoms arise Avoid smoke exposure Stay up-to-date with your annual flu vaccines, COVID vaccines and pneumonia vaccines when indicated.  Atopic Dermatitis:  Daily Care For Maintenance (daily and continue even once eczema controlled) - Use hypoallergenic hydrating ointment at least twice daily.  This must be done daily for control of flares. (Great options include Vaseline, CeraVe, Aquaphor, Aveeno, Cetaphil, VaniCream, etc) - Avoid detergents, soaps or lotions with fragrances/dyes - Limit showers/baths to 5 minutes and use luke warm water instead of hot, pat dry following baths, and apply moisturizer - can use steroid/non-steroid therapy creams as detailed below up to twice weekly for prevention of flares.  For Flares:(add this to maintenance therapy if needed for flares) First apply steroid/non-steroid treatment creams. Wait 5 minutes then apply moisturizer.  - Triamcinolone 0.1% to body for moderate flares-apply topically twice daily to red, raised areas of skin, followed by moisturizer. Do NOT use on face, groin or armpits. - Hydrocortisone 2.5% to face/body-apply topically twice daily to red, raised areas of skin, followed by moisturizer  Consider patch testing if continues having rashes with different soaps or detergents. Use only fragrance and dye free products.   Follow up : 3 months, sooner if needed It was a pleasure meeting you in clinic today! Thank you for allowing me to participate in your care.  Tonny Bollman, MD [facility]

## 2023-04-26 ENCOUNTER — Encounter (HOSPITAL_COMMUNITY): Payer: Self-pay

## 2023-04-26 ENCOUNTER — Emergency Department (HOSPITAL_COMMUNITY): Payer: Medicaid Other

## 2023-04-26 ENCOUNTER — Emergency Department (HOSPITAL_COMMUNITY)
Admission: EM | Admit: 2023-04-26 | Discharge: 2023-04-26 | Disposition: A | Discharge status: Home / Self Care | Payer: Medicaid Other | Attending: Emergency Medicine | Admitting: Emergency Medicine

## 2023-04-26 ENCOUNTER — Other Ambulatory Visit: Payer: Self-pay

## 2023-04-26 DIAGNOSIS — R002 Palpitations: Secondary | ICD-10-CM | POA: Diagnosis not present

## 2023-04-26 DIAGNOSIS — Z91040 Latex allergy status: Secondary | ICD-10-CM | POA: Insufficient documentation

## 2023-04-26 DIAGNOSIS — R079 Chest pain, unspecified: Secondary | ICD-10-CM | POA: Diagnosis present

## 2023-04-26 DIAGNOSIS — M7989 Other specified soft tissue disorders: Secondary | ICD-10-CM | POA: Diagnosis not present

## 2023-04-26 DIAGNOSIS — R072 Precordial pain: Secondary | ICD-10-CM | POA: Diagnosis not present

## 2023-04-26 DIAGNOSIS — R42 Dizziness and giddiness: Secondary | ICD-10-CM | POA: Diagnosis not present

## 2023-04-26 DIAGNOSIS — J45909 Unspecified asthma, uncomplicated: Secondary | ICD-10-CM | POA: Diagnosis not present

## 2023-04-26 DIAGNOSIS — L989 Disorder of the skin and subcutaneous tissue, unspecified: Secondary | ICD-10-CM | POA: Diagnosis not present

## 2023-04-26 DIAGNOSIS — R0602 Shortness of breath: Secondary | ICD-10-CM | POA: Insufficient documentation

## 2023-04-26 LAB — BASIC METABOLIC PANEL
Anion gap: 12 (ref 5–15)
BUN: 7 mg/dL (ref 6–20)
CO2: 23 mmol/L (ref 22–32)
Calcium: 9.4 mg/dL (ref 8.9–10.3)
Chloride: 104 mmol/L (ref 98–111)
Creatinine, Ser: 0.69 mg/dL (ref 0.44–1.00)
GFR, Estimated: >60 mL/min (ref >60)
Glucose, Bld: 77 mg/dL (ref 70–99)
Potassium: 4.1 mmol/L (ref 3.5–5.1)
Sodium: 139 mmol/L (ref 135–145)

## 2023-04-26 LAB — CBC
HCT: 38.9 % (ref 36.0–46.0)
Hemoglobin: 13 g/dL (ref 12.0–15.0)
MCH: 31.1 pg (ref 26.0–34.0)
MCHC: 33.4 g/dL (ref 30.0–36.0)
MCV: 93.1 fL (ref 80.0–100.0)
Platelets: 245 10*3/uL (ref 150–400)
RBC: 4.18 MIL/uL (ref 3.87–5.11)
RDW: 12.7 % (ref 11.5–15.5)
WBC: 9.3 10*3/uL (ref 4.0–10.5)
nRBC: 0 % (ref 0.0–0.2)

## 2023-04-26 LAB — HCG, SERUM, QUALITATIVE: Preg, Serum: NEGATIVE

## 2023-04-26 LAB — TROPONIN I (HIGH SENSITIVITY): Troponin I (High Sensitivity): <2 ng/L (ref <18)

## 2023-04-26 LAB — D-DIMER, QUANTITATIVE: D-Dimer, Quant: 0.47 ug{FEU}/mL (ref 0.00–0.50)

## 2023-04-26 NOTE — ED Triage Notes (Signed)
Pt to ED from home c/o CP which began around midnight. Pt also endorses some mild swelling to her right leg. Pt states she has been undergoing several pulmonary tests recently and is supposed to have a CTA some time next week. Arrives A+O, VSS, NADN.

## 2023-04-26 NOTE — Discharge Instructions (Addendum)
Patient to follow-up with your cardiology appointment on September 4  Patient to follow-up with your PCP in [place] in the next 2 weeks.  Showed on the MyChart results for your right arm/humerus x-ray You may need to have a follow-up MRI of the arm cyst

## 2023-04-26 NOTE — ED Provider Notes (Signed)
[place] EMERGENCY DEPARTMENT AT [facility] Provider Note   CSN: [id number] Arrival date & time: 04/26/23  7829     History  Chief Complaint  Patient presents with   Chest Pain    Sandra Gilbert is a 37 y.o. female.  The history is provided by the patient and the spouse.   Patient with history of asthma, PSVT presents with chest pain.  Patient reports over the past days she has had increasing frequency of left-sided sharp chest pain that last for about a minute.  She reports shortness of breath.  No fevers or vomiting.  No diaphoresis.  She reports lightheadedness but no syncope.  She reports she gets these chest pain episodes frequently but this is worse than usual.  She has already been seen by cardiology previously for the symptoms.  No previous history of CAD/VTE.  Patient does report increasing right leg pain and swelling. No recent trauma. Patient reports she continues to smoke but is interested in quitting   She also gets palpitations frequently due to her SVT  Other than right leg swelling, no other swelling or pain in her extremities. Home Medications Prior to Admission medications   Medication Sig Start Date End Date Taking? Authorizing Provider  albuterol (VENTOLIN HFA) 108 (90 Base) MCG/ACT inhaler Inhale 2 puffs into the lungs every 6 (six) hours as needed for wheezing or shortness of breath. 04/23/23   Verlee Monte, MD  cetirizine (ZYRTEC ALLERGY) 10 MG tablet Take 1 tablet (10 mg total) by mouth daily. 04/23/23   Verlee Monte, MD  diphenhydrAMINE (BENADRYL) 12.5 MG/5ML elixir Take 10-20 mLs by mouth 4 (four) times daily as needed for itching or allergies.    [provider]  EPINEPHrine (EPIPEN JR) 0.15 MG/0.3ML injection Inject 0.15 mg into the muscle as needed for anaphylaxis.    [provider]  Etonogestrel (NEXPLANON [place]) Inject 1 each into the skin daily. Will be removed on 05/06/2023    [provider]  fluticasone  (FLONASE) 50 MCG/ACT nasal spray Place 1 spray into both nostrils daily. 04/23/23   Verlee Monte, MD  hydrocortisone 2.5 % ointment Apply topically twice daily as need to red sandpapery rash. 04/23/23   Verlee Monte, MD  hydrOXYzine (ATARAX) 25 MG tablet Take 25 mg by mouth 2 (two) times daily. 03/27/23   [provider]  Levonorgestrel (MIRENA, 52 MG, IU) 1 each by Intrauterine route daily.    [provider]  metoprolol tartrate (LOPRESSOR) 25 MG tablet Take 1 tablet (25 mg total) by mouth 2 (two) times daily. 04/22/23   Madireddy, Marlyn Corporal, MD  Olopatadine HCl (PATADAY) 0.2 % SOLN Place 1 drop into both eyes daily as needed. 04/23/23   Verlee Monte, MD  sertraline (ZOLOFT) 50 MG tablet Take 50 mg by mouth daily. 03/27/23   [provider]  triamcinolone ointment (KENALOG) 0.1 % Apply topically twice daily to BODY as needed for red, sandpaper like rash.  Do not use on face, groin or armpits. 04/23/23   Verlee Monte, MD  Vitamin D, Ergocalciferol, (DRISDOL) 1.25 MG (50000 UNIT) CAPS capsule Take 50,000 Units by mouth every 7 (seven) days. Last week    [provider]      Allergies    Casein, Aspirin, and Latex    Review of Systems   Review of Systems  Constitutional:  Negative for fever.  Cardiovascular:  Positive for chest pain, palpitations and leg swelling.  Gastrointestinal:  Negative for vomiting.    Physical Exam Updated Vital Signs BP (!) 99/58 (BP Location: Right Arm)   Pulse 80   Temp 98.2 F (36.8 C) (Oral)   Resp 20   SpO2 96%  Physical Exam CONSTITUTIONAL: Thin appearing, anxious HEAD: Normocephalic/atraumatic EYES: EOMI/PERRL ENMT: Mucous membranes moist NECK: supple no meningeal signs SPINE/BACK:entire spine nontender CV: S1/S2 noted, no murmurs/rubs/gallops noted LUNGS: Lungs are clear to auscultation bilaterally, no apparent distress ABDOMEN: soft, nontender, no rebound or guarding, bowel sounds noted throughout  abdomen GU:no cva tenderness NEURO: Pt is awake/alert/appropriate, moves all extremitiesx4.  No facial droop.   EXTREMITIES: pulses normal/equal, full ROM Mild tenderness noted to the right thigh and right calf.  Distal pulses equal and intact.  No erythema.  Minimal edema is noted right lower extremity. Full range of motion of both lower extremities without difficulty No tenderness or visible trauma in the right upper extremity.  No edema.  No bruising Patient has Nexplanon in place SKIN: warm, color normal PSYCH: Anxious  ED Results / Procedures / Treatments   Labs (all labs ordered are listed, but only abnormal results are displayed) Labs Reviewed  BASIC METABOLIC PANEL  CBC  HCG, SERUM, QUALITATIVE  D-DIMER, QUANTITATIVE  TROPONIN I (HIGH SENSITIVITY)    EKG ED ECG REPORT   Date: 04/26/2023 0453am  Rate: 73  Rhythm: normal sinus rhythm  QRS Axis: normal  Intervals: normal  ST/T Wave abnormalities: normal  Conduction Disutrbances:none  Narrative Interpretation:   Old EKG Reviewed: unchanged  I have personally reviewed the EKG tracing and agree with the computerized printout as noted.   Radiology DG Chest 2 View  Result Date: 04/26/2023 CLINICAL DATA:  Chest pain EXAM: CHEST - 2 VIEW COMPARISON:  12/06/2020 FINDINGS: Check shadow is within normal limits. Lungs are clear bilaterally. Bony structures of the thorax are within normal limits. There is an expansile lesion with increased sclerosis in the midshaft of the right humerus. Nexplanon implant is noted on the right as well. IMPRESSION: No acute intrathoracic abnormality noted. Expansile lesion in the midshaft of the right humerus incompletely evaluated on this exam. Dedicated humeral films are recommended. Electronically Signed   By: Alcide Clever M.D.   On: 04/26/2023 04:13    Procedures Procedures    Medications Ordered in ED Medications - No data to display  ED Course/ Medical Decision Making/ A&P Clinical  Course as of 04/26/23 8841  Wynelle Link Apr 26, 2023  6606 Patient reports she gets chest pain frequently has been seen by cardiology, but today's episodes were more frequent.  She is scheduled to have an outpatient CT coronary next month.  Also reports right leg pain and swelling.  Will check D-dimer.  Incidental finding noted on chest x-ray, will add on right humerus x-ray [DW]  [phone] Patient stable in the ER.  Low risk for venous thromboembolism and D-dimer is negative.  Will defer further workup.  Troponin unremarkable. [DW]  [phone] Patient with incidental finding in her right humerus.  She absolutely denies any right arm pain.  No previous trauma or fractures is reported.  There is no swelling to the arm or deformity  Discussed x-ray findings with Dr. Llana Aliment with radiology.  He reports the musculoskeletal radiologist will read the findings later.  Patient will need follow-up with final read and follow-up with her PCP  This is discussed at length with patient and her husband.  She has PCP follow-up in about 2 weeks, [DW]  0657 As this might be  a benign cyst.  Patient has no pain at this time will need to show the results there and will likely need MRI [DW]    Clinical Course User Index [DW] Zadie Rhine, MD                                 Medical Decision Making Amount and/or Complexity of Data Reviewed Labs: ordered. Radiology: ordered. ECG/medicine tests: ordered.   This patient presents to the ED for concern of chest pain, this involves an extensive number of treatment options, and is a complaint that carries with it a high risk of complications and morbidity.  The differential diagnosis includes but is not limited to acute coronary syndrome, aortic dissection, pulmonary embolism, pericarditis, pneumothorax, pneumonia, myocarditis, pleurisy, esophageal rupture    Comorbidities that complicate the patient evaluation: Patient's presentation is complicated by their history of  asthma  Social Determinants of Health: Patient's  ongoing tobacco use and home stressors   increases the complexity of managing their presentation  Additional history obtained: Additional history obtained from spouse Records reviewed  cardiology notes reviewed  Lab Tests: I Ordered, and personally interpreted labs.  The pertinent results include: Labs overall unremarkable  Imaging Studies ordered: I ordered imaging studies including X-ray chest   I independently visualized and interpreted imaging which showed no acute thoracic findings I agree with the radiologist interpretation  Cardiac Monitoring: The patient was maintained on a cardiac monitor.  I personally viewed and interpreted the cardiac monitor which showed an underlying rhythm of:  sinus rhythm   Test Considered: Patient is low risk / negative by Wells criteria, therefore do not feel that VTE workup is indicated.   Consultations Obtained: I requested consultation with the radiologist entrikin , and discussed  findings as well as pertinent plan - they recommend: f/u as outpatient for benign lesion   Reevaluation: After the interventions noted above, I reevaluated the patient and found that they have :improved  Complexity of problems addressed: Patient's presentation is most consistent with  acute presentation with potential threat to life or bodily function  Disposition: After consideration of the diagnostic results and the patient's response to treatment,  I feel that the patent would benefit from discharge   .           Final Clinical Impression(s) / ED Diagnoses Final diagnoses:  Precordial pain  Palpitations  Arm lesion    Rx / DC Orders ED Discharge Orders     None         Zadie Rhine, MD 04/26/23 0700

## 2023-04-27 ENCOUNTER — Telehealth: Payer: Self-pay

## 2023-04-27 NOTE — Telephone Encounter (Signed)
Pt c/o medication issue:  1. Name of Medication:  metoprolol tartrate (LOPRESSOR) 25 MG tablet  2. How are you currently taking this medication (dosage and times per day)?   3. Are you having a reaction (difficulty breathing--STAT)?   4. What is your medication issue?  Patient states she has not started on the medication yet. She states her PCP is worried about her BP lowering. Patient provided a few readings and she also mentions having chest pain and going to the ED yesterday night.  7/26: 118/76  8/09:108/60  8/22: 110/78  8/23: 90/50 8/21: 90/62      Pt c/o of Chest Pain: STAT if CP now or developed within 24 hours  1. Are you having CP right now?  No   2. Are you experiencing any other symptoms (ex. SOB, nausea, vomiting, sweating)?  No   3. How long have you been experiencing CP?  Has been ongoing but remained consistent yesterday and she went to the ED   4. Is your CP continuous or coming and going?  Coming and going   5. Have you taken Nitroglycerin?  No patient states she does not have any nitro ?

## 2023-04-28 LAB — TRYPTASE: Tryptase: 6.4 ug/L (ref 2.2–13.2)

## 2023-04-28 LAB — ALLERGEN,CHILI PEPPER,RF279: F279-IgE Chili Pepper: <0.1 kU/L

## 2023-04-28 LAB — ALLERGEN,GRN PEPPER,PAPRIKA,F218: Paprika IgE: <0.1 kU/L

## 2023-04-29 LAB — BASIC METABOLIC PANEL
BUN/Creatinine Ratio: 10 (ref 9–23)
BUN: 6 mg/dL (ref 6–20)
CO2: 24 mmol/L (ref 20–29)
Calcium: 9.1 mg/dL (ref 8.7–10.2)
Chloride: 105 mmol/L (ref 96–106)
Creatinine, Ser: 0.6 mg/dL (ref 0.57–1.00)
Glucose: 83 mg/dL (ref 70–99)
Potassium: 4.2 mmol/L (ref 3.5–5.2)
Sodium: 136 mmol/L (ref 134–144)
eGFR: 120 mL/min/{1.73_m2} (ref >59)

## 2023-05-01 ENCOUNTER — Other Ambulatory Visit: Payer: Self-pay | Admitting: Medical Genetics

## 2023-05-01 ENCOUNTER — Encounter (HOSPITAL_COMMUNITY): Payer: Self-pay

## 2023-05-01 DIAGNOSIS — Z006 Encounter for examination for normal comparison and control in clinical research program: Secondary | ICD-10-CM

## 2023-05-06 ENCOUNTER — Ambulatory Visit (HOSPITAL_COMMUNITY): Payer: Medicaid Other

## 2023-05-11 ENCOUNTER — Telehealth: Payer: Self-pay

## 2023-05-11 NOTE — Telephone Encounter (Signed)
Pt requesting a switch from Dr. Vincent Gros to Dr. Mauri Brooklyn, is this switch okay?

## 2023-05-12 ENCOUNTER — Telehealth (HOSPITAL_COMMUNITY): Payer: Self-pay | Admitting: *Deleted

## 2023-05-12 NOTE — Telephone Encounter (Signed)
Calling patient to cancel her upcoming cardiac CT appointment due to insurance denial.  Informed her that the appeals process has been initiated and we will wait for the results before proceeding with scheduling. She verbalized understanding.  Larey Brick RN Navigator Cardiac Imaging [facility] [phone] Office [phone] Cell

## 2023-05-13 ENCOUNTER — Ambulatory Visit (HOSPITAL_COMMUNITY): Admission: RE | Admit: 2023-05-13 | Payer: Medicaid Other | Source: Ambulatory Visit

## 2023-05-21 ENCOUNTER — Ambulatory Visit: Payer: Medicaid Other | Admitting: Cardiology

## 2023-05-21 VITALS — BP 100/60 | HR 81 | Ht 64.0 in | Wt 106.4 lb

## 2023-05-21 DIAGNOSIS — R0609 Other forms of dyspnea: Secondary | ICD-10-CM

## 2023-05-21 DIAGNOSIS — J45909 Unspecified asthma, uncomplicated: Secondary | ICD-10-CM | POA: Diagnosis not present

## 2023-05-21 DIAGNOSIS — I471 Supraventricular tachycardia, unspecified: Secondary | ICD-10-CM | POA: Diagnosis not present

## 2023-05-21 DIAGNOSIS — R002 Palpitations: Secondary | ICD-10-CM | POA: Diagnosis not present

## 2023-05-21 DIAGNOSIS — R0602 Shortness of breath: Secondary | ICD-10-CM

## 2023-05-21 NOTE — Addendum Note (Signed)
Addended by: Baldo Ash D on: 05/21/2023 02:44 PM   Modules accepted: Orders

## 2023-05-21 NOTE — Patient Instructions (Addendum)
Medication Instructions:  Your physician recommends that you continue on your current medications as directed. Please refer to the Current Medication list given to you today.  *If you need a refill on your cardiac medications before your next appointment, please call your pharmacy*   Lab Work: None Ordered If you have labs (blood work) drawn today and your tests are completely normal, you will receive your results only by: MyChart Message (if you have MyChart) OR A paper copy in the mail If you have any lab test that is abnormal or we need to change your treatment, we will call you to review the results.   Testing/Procedures:  Echocardiogram - May 27, 2023 @ 9:30AM   WHY IS MY DOCTOR PRESCRIBING ZIO? The Zio system is proven and trusted by physicians to detect and diagnose irregular heart rhythms -- and has been prescribed to hundreds of thousands of patients.  The FDA has cleared the Zio system to monitor for many different kinds of irregular heart rhythms. In a study, physicians were able to reach a diagnosis 90% of the time with the Zio system1.  You can wear the Zio monitor -- a small, discreet, comfortable patch -- during your normal day-to-day activity, including while you sleep, shower, and exercise, while it records every single heartbeat for analysis.  1Barrett, P., et al. Comparison of 24 Hour Holter Monitoring Versus 14 Day Novel Adhesive Patch Electrocardiographic Monitoring. American Journal of Medicine, 2014.  ZIO VS. HOLTER MONITORING The Zio monitor can be comfortably worn for up to 14 days. Holter monitors can be worn for 24 to 48 hours, limiting the time to record any irregular heart rhythms you may have. Zio is able to capture data for the 51% of patients who have their first symptom-triggered arrhythmia after 48 hours.1  LIVE WITHOUT RESTRICTIONS The Zio ambulatory cardiac monitor is a small, unobtrusive, and water-resistant patch--you might even forget you're  wearing it. The Zio monitor records and stores every beat of your heart, whether you're sleeping, working out, or showering.     Follow-Up: At [facility], you and your health needs are our priority.  As part of our continuing mission to provide you with exceptional heart care, we have created designated Provider Care Teams.  These Care Teams include your primary Cardiologist (physician) and Advanced Practice Providers (APPs -  Physician Assistants and Nurse Practitioners) who all work together to provide you with the care you need, when you need it.  We recommend signing up for the patient portal called "MyChart".  Sign up information is provided on this After Visit Summary.  MyChart is used to connect with patients for Virtual Visits (Telemedicine).  Patients are able to view lab/test results, encounter notes, upcoming appointments, etc.  Non-urgent messages can be sent to your provider as well.   To learn more about what you can do with MyChart, go to [email or web address].    Your next appointment:   2 month(s)  The format for your next appointment:   In Person  Provider:   Gypsy Balsam, MD    Other Instructions NA

## 2023-05-21 NOTE — Progress Notes (Addendum)
Cardiology Office Note:    Date:  05/21/2023   ID:  Sandra Gilbert, DOB [birth year], MRN [id number]  PCP:  Sherlie Ban, NP  Cardiologist:  Gypsy Balsam, MD    Referring MD: [facility]*   Chief Complaint  Patient presents with   Chest Pain   Dizziness   Migraine    Describes as severe head pain    History of Present Illness:    Sandra Gilbert is a 37 y.o. female past medical history significant for supraventricular tachycardia, bipolar disorder, anxiety, posttraumatic stress disorder.  She presented today to the office with numerous complaints she started with the pain in the right leg.  She is that her legs feel swollen however the only physical exam I do not see the swelling.  Apparently she did have DVT study done which was already negative.  Second complaint that she has is migraine headache that she got 1 day she ended up going to the emergency room she was evaluated was given some migraine pill did not help much end up going to her primary care physician got another medication finally after couple hours pain completely subsided that happened last Wednesday since that time she is doing fine.  She tells me she used to have migraine she was little but not anymore this is first episode since that time looking somewhat different.  Additional problem she complains having palpitations she will feel her heart skipping a speeding up with no significant warning also complained of having chest pain she said those pains typically last for few seconds up to maybe 30 seconds she described this as squeezing and heaviness not related to exercise does not happen in stressful situation she tells me that she was an active and she did have coronary CT angio done over there which was normal.  Will look for documentation of it.  She described very stressful situation at home.  She got 3 children at home 2 of them autistic.  Past Medical History:  Diagnosis Date   Abnormal electrocardiogram     Anxiety 2014   PTSD   Asthma    Bipolar II disorder (HCC) 12/24/2020   Dental cavities    very poor dental hygeine   Depression    Missed abortion 11/03/2014   Palpitations    Shortness of breath    Smoker 12/02/2018   Vaginal delivery 2006, 2011    Past Surgical History:  Procedure Laterality Date   CESAREAN SECTION  2010   DILATION AND EVACUATION N/A 11/03/2014   Procedure: DILATATION AND EVACUATION;  Surgeon: Adam Phenix, MD;  Location: [facility];  Service: Gynecology;  Laterality: N/A;    Current Medications: Current Meds  Medication Sig   albuterol (VENTOLIN HFA) 108 (90 Base) MCG/ACT inhaler Inhale 2 puffs into the lungs every 6 (six) hours as needed for wheezing or shortness of breath.   cetirizine (ZYRTEC ALLERGY) 10 MG tablet Take 1 tablet (10 mg total) by mouth daily.   diphenhydrAMINE (BENADRYL) 12.5 MG/5ML elixir Take 10-20 mLs by mouth 4 (four) times daily as needed for itching or allergies.   EPINEPHrine (EPIPEN JR) 0.15 MG/0.3ML injection Inject 0.15 mg into the muscle as needed for anaphylaxis.   fluticasone (FLONASE) 50 MCG/ACT nasal spray Place 1 spray into both nostrils daily.   hydrocortisone 2.5 % ointment Apply topically twice daily as need to red sandpapery rash. (Patient taking differently: Apply 1 Application topically 2 (two) times daily as needed (rash). Apply topically twice daily as need  to red sandpapery rash.)   hydrOXYzine (ATARAX) 25 MG tablet Take 25 mg by mouth 2 (two) times daily.   Levonorgestrel (MIRENA, 52 MG, IU) 1 each by Intrauterine route daily.   Olopatadine HCl (PATADAY) 0.2 % SOLN Place 1 drop into both eyes daily as needed. (Patient taking differently: Place 1 drop into both eyes daily as needed (Eye dryness).)   omeprazole (PRILOSEC) 40 MG capsule Take 40 mg by mouth daily.   sertraline (ZOLOFT) 100 MG tablet Take 100 mg by mouth daily.   triamcinolone ointment (KENALOG) 0.1 % Apply topically twice daily to BODY as needed for red,  sandpaper like rash.  Do not use on face, groin or armpits. (Patient taking differently: Apply 1 Application topically 2 (two) times daily. Apply topically twice daily to BODY as needed for red, sandpaper like rash.  Do not use on face, groin or armpits.)     Allergies:   Aspirin and Latex   Social History   Socioeconomic History   Marital status: Married    Spouse name: Not on file   Number of children: Not on file   Years of education: Not on file   Highest education level: Not on file  Occupational History   Not on file  Tobacco Use   Smoking status: Every Day    Current packs/day: 1.00    Average packs/day: 1 pack/day for 15.0 years (15.0 ttl pk-yrs)    Types: Cigarettes   Smokeless tobacco: Never  Substance and Sexual Activity   Alcohol use: No   Drug use: No   Sexual activity: Not Currently    Birth control/protection: None  Other Topics Concern   Not on file  Social History Narrative   Not on file   Social Determinants of Health   Financial Resource Strain: Not on file  Food Insecurity: Not on file  Transportation Needs: Not on file  Physical Activity: Not on file  Stress: Not on file  Social Connections: Not on file     Family History: The patient's family history includes Diabetes in her mother; Heart disease in her mother; Muscular dystrophy in her father. ROS:   Please see the history of present illness.    All 14 point review of systems negative except as described per history of present illness  EKGs/Labs/Other Studies Reviewed:    EKG Interpretation Date/Time:  Thursday May 21 2023 13:54:00 EDT Ventricular Rate:  81 PR Interval:  128 QRS Duration:  80 QT Interval:  376 QTC Calculation: 436 R Axis:   89  Text Interpretation: Normal sinus rhythm Normal ECG When compared with ECG of 26-Apr-2023 04:53, PREVIOUS ECG IS PRESENT Confirmed by Gypsy Balsam [phone]) on 05/21/2023 2:03:57 PM    Recent Labs: 04/26/2023: Hemoglobin 13.0;  Platelets 245 04/28/2023: BUN 6; Creatinine, Ser 0.60; Potassium 4.2; Sodium 136  Recent Lipid Panel No results found for: "CHOL", "TRIG", "HDL", "CHOLHDL", "VLDL", "LDLCALC", "LDLDIRECT"  Physical Exam:    VS:  BP 100/60 (BP Location: Left Arm, Patient Position: Sitting)   Pulse 81   Ht 5\' 4"  (1.626 m)   Wt 106 lb 6.4 oz (48.3 kg)   SpO2 94%   BMI 18.26 kg/m     Wt Readings from Last 3 Encounters:  05/21/23 106 lb 6.4 oz (48.3 kg)  04/23/23 105 lb 1.6 oz (47.7 kg)  04/22/23 110 lb (49.9 kg)     GEN:  Well nourished, well developed in no acute distress HEENT: Normal NECK: No JVD; No carotid bruits  LYMPHATICS: No lymphadenopathy CARDIAC: RRR, no murmurs, no rubs, no gallops RESPIRATORY:  Clear to auscultation without rales, wheezing or rhonchi  ABDOMEN: Soft, non-tender, non-distended MUSCULOSKELETAL:  No edema; No deformity  SKIN: Warm and dry LOWER EXTREMITIES: no swelling NEUROLOGIC:  Alert and oriented x 3 PSYCHIATRIC:  Normal affect   ASSESSMENT:    1. Palpitations   2. PSVT (paroxysmal supraventricular tachycardia)   3. Asthma, unspecified asthma severity, unspecified whether complicated, unspecified whether persistent   4. Shortness of breath   5. Dyspnea on exertion    PLAN:    In order of problems listed above:  Palpitations: Will ask her to wear Zio patch for 2 weeks to see if she get any significant arrhythmia. Supraventricular tachycardia plan as described above. Shortness of breath will ask her to have an echocardiogram done to assess left ventricle ejection fraction. I suspect significant portion of her symptomatology is related to her psychological status and stressful situation at home.  Clearly there is some component of anxiety.  However before we label that anxiety is responsible for all the symptoms we need to rule out significant organic problem and this will have intended to do.  Apparently after I left the room and dictated in my note she read  my note that became very upset about statement that I suspect she may have some component of anxiety.  She simply left our office to be contacted over the phone but she said she does not want to have any testing done and was very upset.   Medication Adjustments/Labs and Tests Ordered: Current medicines are reviewed at length with the patient today.  Concerns regarding medicines are outlined above.  Orders Placed This Encounter  Procedures   EKG 12-Lead   Medication changes: No orders of the defined types were placed in this encounter.   Signed, Georgeanna Lea, MD, [facility] 05/21/2023 2:23 PM    [facility]

## 2023-05-27 ENCOUNTER — Other Ambulatory Visit: Payer: Medicaid Other

## 2023-07-23 ENCOUNTER — Ambulatory Visit: Payer: Medicaid Other

## 2023-07-24 ENCOUNTER — Other Ambulatory Visit: Payer: Self-pay

## 2023-07-24 ENCOUNTER — Ambulatory Visit (INDEPENDENT_AMBULATORY_CARE_PROVIDER_SITE_OTHER): Payer: Medicaid Other | Admitting: Internal Medicine

## 2023-07-24 ENCOUNTER — Encounter: Payer: Self-pay | Admitting: Internal Medicine

## 2023-07-24 VITALS — BP 104/62 | HR 85 | Temp 98.1°F | Resp 20 | Ht 63.0 in | Wt 110.0 lb

## 2023-07-24 DIAGNOSIS — T7800XD Anaphylactic reaction due to unspecified food, subsequent encounter: Secondary | ICD-10-CM

## 2023-07-24 DIAGNOSIS — J4489 Other specified chronic obstructive pulmonary disease: Secondary | ICD-10-CM | POA: Diagnosis not present

## 2023-07-24 DIAGNOSIS — T63421D Toxic effect of venom of ants, accidental (unintentional), subsequent encounter: Secondary | ICD-10-CM

## 2023-07-24 DIAGNOSIS — J0101 Acute recurrent maxillary sinusitis: Secondary | ICD-10-CM

## 2023-07-24 DIAGNOSIS — F17200 Nicotine dependence, unspecified, uncomplicated: Secondary | ICD-10-CM

## 2023-07-24 DIAGNOSIS — J3089 Other allergic rhinitis: Secondary | ICD-10-CM

## 2023-07-24 DIAGNOSIS — T7800XA Anaphylactic reaction due to unspecified food, initial encounter: Secondary | ICD-10-CM | POA: Insufficient documentation

## 2023-07-24 DIAGNOSIS — L309 Dermatitis, unspecified: Secondary | ICD-10-CM | POA: Insufficient documentation

## 2023-07-24 DIAGNOSIS — L308 Other specified dermatitis: Secondary | ICD-10-CM

## 2023-07-24 DIAGNOSIS — J302 Other seasonal allergic rhinitis: Secondary | ICD-10-CM | POA: Diagnosis not present

## 2023-07-24 DIAGNOSIS — H1013 Acute atopic conjunctivitis, bilateral: Secondary | ICD-10-CM | POA: Insufficient documentation

## 2023-07-24 MED ORDER — AMOXICILLIN-POT CLAVULANATE 875-125 MG PO TABS: 1.0000 | ORAL_TABLET | Freq: Two times a day (BID) | ORAL | 0 refills | Status: AC

## 2023-07-24 MED ORDER — EPINEPHRINE 0.3 MG/0.3ML IJ SOAJ: 0.3000 mg | INTRAMUSCULAR | 2 refills | Status: AC | PRN

## 2023-07-24 MED ORDER — BUDESONIDE-FORMOTEROL FUMARATE 160-4.5 MCG/ACT IN AERO: 2.0000 | INHALATION_SPRAY | Freq: Two times a day (BID) | RESPIRATORY_TRACT | 5 refills | Status: DC

## 2023-07-24 MED ORDER — MONTELUKAST SODIUM 10 MG PO TABS: 10.0000 mg | ORAL_TABLET | Freq: Every day | ORAL | 5 refills | Status: DC

## 2023-07-24 MED ORDER — CROMOLYN SODIUM 4 % OP SOLN: 1.0000 [drp] | Freq: Four times a day (QID) | OPHTHALMIC | 3 refills | Status: DC | PRN

## 2023-07-24 MED ORDER — TRIAMCINOLONE ACETONIDE 0.1 % EX OINT: TOPICAL_OINTMENT | CUTANEOUS | 1 refills | Status: DC

## 2023-07-24 MED ORDER — METHYLPREDNISOLONE ACETATE 40 MG/ML IJ SUSP
40.0000 mg | Freq: Once | INTRAMUSCULAR | Status: AC
Start: 2023-07-24 — End: 2023-07-24
  Administered 2023-07-24: 40 mg via INTRAMUSCULAR

## 2023-07-24 MED ORDER — PREDNISONE 20 MG PO TABS: 20.0000 mg | ORAL_TABLET | Freq: Every day | ORAL | 0 refills | Status: AC

## 2023-07-24 MED ORDER — CETIRIZINE HCL 10 MG PO TABS: 10.0000 mg | ORAL_TABLET | Freq: Every day | ORAL | 5 refills | Status: DC

## 2023-07-24 MED ORDER — FLUTICASONE PROPIONATE 50 MCG/ACT NA SUSP: 1.0000 | Freq: Every day | NASAL | 5 refills | Status: DC

## 2023-07-24 MED ORDER — HYDROCORTISONE 2.5 % EX OINT: TOPICAL_OINTMENT | CUTANEOUS | 2 refills | Status: AC

## 2023-07-24 MED ORDER — SPIRIVA RESPIMAT 1.25 MCG/ACT IN AERS: 2.0000 | INHALATION_SPRAY | Freq: Every day | RESPIRATORY_TRACT | 5 refills | Status: DC

## 2023-07-24 NOTE — Patient Instructions (Addendum)
Acute maxillary sinusitis, recurrent - 40 mg IM Depo in clinic, 20 mg prednisone tablets start tomorrow x 4 days - Start Augmentin 875 mg twice daily for 7 days.  Take with meals.  Take with probiotics or live cultured yogurt's. Maximizing allergy and asthma/COPD therapies as below for future prevention.  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 - for SKIN only reaction, okay to take Benadryl 2 capsules every 4 hours - for SKIN + ANY additional symptoms, OR IF concern for LIFE THREATENING reaction = Epipen Autoinjector EpiPen 0.3 mg. - If using Epinephrine autoinjector, call 911 - A food allergy action plan has been provided and discussed. - Medic Alert identification is recommended.  Seasonal and perennial allergic rhinitis: - allergy testing 04/2023 was positive to box elder tree pollen, dust mites and cat - allergen avoidance as below - Continue Zyrtec (cetirizine)  10 mg   daily as needed. - Start Singulair (motenlukast) 10 mg nightly-stop if nightmares or behavior changes. - Consider nasal saline rinses as needed to help remove pollens, mucus and hydrate nasal mucosa - Continue Flonase (fluticasone) 1 spray in each nostril TWICE daily  Best results if used daily.  Discontinue if recurrent nose bleeds. - consider allergy shots as long term control of your symptoms by teaching your immune system to be more tolerant of your allergy triggers  Allergic Conjunctivitis:  - Start cromolyn eye drops-1 drop each eye 4 times daily as needed  Asthma/COPD: - your lung testing today mixed obstruction and restriction Start: Start Symbicort 160 mcg 2 puffs twice daily + Spiriva 2 puffs daily Singulair 10 mg nightly-if nightmares or behavior problems - Trial of Rescue Inhaler: Albuterol (Proair/Ventolin) 2 puffs . Use  every 4-6 hours as needed for chest tightness, wheezing, or coughing.   Can also use 15  minutes prior to exercise if you have symptoms with activity. - symptoms are not controlled if:  - Symptoms are occurring >2 times a week OR  - >2 times a month nighttime awakenings  - You are requiring systemic steroids (prednisone/steroid injections) more than once per year  - Your require hospitalization for your asthma.  - Please call the clinic to schedule a follow up if these symptoms arise Avoid smoke exposure Stay up-to-date with your annual flu vaccines, COVID vaccines and pneumonia vaccines when indicated. When you are ready to quit smoking, let us know-or your PCP.  Atopic Dermatitis:  Daily Care For Maintenance (daily and continue even once eczema controlled) - Use hypoallergenic hydrating ointment at least twice daily.  This must be done daily for control of flares. (Great options include Vaseline, CeraVe, Aquaphor, Aveeno, Cetaphil, VaniCream, etc) - Avoid detergents, soaps or lotions with fragrances/dyes - Limit showers/baths to 5 minutes and use luke warm water instead of hot, pat dry following baths, and apply moisturizer - can use steroid/non-steroid therapy creams as detailed below up to twice weekly for prevention of flares.  For Flares:(add this to maintenance therapy if needed for flares) First apply steroid/non-steroid treatment creams. Wait 5 minutes then apply moisturizer.  - Triamcinolone 0.1% to body for moderate flares-apply topically twice daily to red, raised areas of skin, followed by moisturizer. Do NOT use on face, groin or armpits. - Hydrocortisone 2.5% to face/body-apply topically twice daily to red, raised areas of skin, followed by moisturizer  Fire Ant Allergy:  - labs today  - if positive, will recommend allergy injections toward fire ants -  if bit and have reaction of hives only-okay to take benadryl 50 mg every 4-6 hours - if bit and have reaction of hives + any other symptom (nausea, vomiting, lightheadedness, tongue swelling, difficulty breathing,  etc..) use epipen. Call 911.  Consider patch testing if continues having rashes with different soaps or detergents. Use only fragrance and dye free products.   Follow up : 3 months, sooner if needed It was a pleasure meeting you in clinic today! Thank you for allowing me to participate in your care.  Tonny Bollman, MD [facility]

## 2023-07-24 NOTE — Progress Notes (Signed)
FOLLOW UP Date of Service/Encounter:  07/24/23  Subjective:  Sandra Gilbert (DOB: 1986/01/07) is a 37 y.o. female who [facility] in re-evaluation of the following: asthma/copd, food allergy, eczema, ant bite reaction History obtained from: chart review and patient.  For Review, LV was on 04/23/23  with Dr.Shalisha Clausing seen for intial visit for reaction to food, ashtma/copd, eczema nad allergic rhinitis . See below for summary of history and diagnostics.  ----------------------------------------------------- Pertinent History/Diagnostics:  Asthma/COPD  did have childhood asthma, but has no current inhalers.  Last used an inhaler after running after a dog and couldn't breath but this was many years ago. Not getting systemic steroids due to breathing issues She does get SOB regularly. She is seeing a cardiologist.  Multiple times per day will feel SOB, heat and exercise trigger symptoms. chronic smoker. Smokes 1/2 pack a day of mentholated cigarettes  - Nonobstructive ratio, low FEV1, possible restriction.  With significant improvement in FEV1 of 12% following post bronchodilator  spirometry 04/23/23: R 0.72 pre 0.78 post, FEV1 60% pre, 67% post; FVC 2.49 L pre, 2.59 L post Allergic Rhinitis:  When teenager, she had an allergy testing and was told she was allergic to many things.  She is not on allergy meds. Rash from grass. Described as itchy. Gets watery nose, watery, itchy eyes. Gets frequent sinus infections in the fall.  - SPT environmental panel (04/2023): negative to green peppers  Adverse Food Reaction:  Ate a Mediterranean salad with pepper from her garden a few months ago. Pepper was extremely spicy.  She drank milk to help with the extreme burning sensation. Felt she swallowed a cactus, 6 hours later went to UC and had rash and trouble breathing. Her husband who was with her on the phone noticed her voice sounded different, she was wheezing.  She felt her skin was on fire like she was bitten by ants. Tx; steroid shot and benadryl. Where she touched the pepper her skin was also severely red and irritated. Rest of that week she had issue eating hotdog with paprika and felt scratchiness in the back of her throat. Immediately gave herself benadryl. Has been avoiding pepper since. - SPT select foods (04/2023): positive to box elder tree pollen, dust mites and cat  - 04/23/23: negative to chili pepper, paprika, normal tryptase 6.4,  Eczema:  rash from different detergents and soaps.  She does have rash on her hands mostly, in winter will get it on her elbows thighs and sometimes tops of feet Localized reaction from fire ants : Fire ants-stepped in a nest and entire legs became swollen. She does get blisters from ant bites.  Other: ADHD, PTSD, bipolar II, anxiety, PSVT  --------------------------------------------------- Today presents for follow-up. Discussed the use of AI scribe software for clinical note transcription with the patient, who gave verbal consent to proceed.  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 After consuming salsa made with these peppers, she experienced significant oral swelling, likened to a 'giant mosquito bite.' She managed the reaction with frequent doses of Benadryl, expressing fear of using her EpiPen due to her cardiac history. She has since avoided all spicy peppers, with family members taste-testing food for heat before she consumes it. She is able to tolerate green peppers, paprika, chili powder.  In addition to food allergies, the patient has a history of reactions  to ant bites. A recent incident involving ants from a houseplant resulted in widespread welting, even in areas not directly bitten. The reaction lasted approximately 24 hours, managed with Benadryl. She did not experience any  respiratory symptoms but did report feeling nauseous.  The patient also reports frequent use of albuterol, indicating a possible exacerbation of a pre-existing respiratory condition. She describes an increase in mucus production and suspects the onset of a sinus infection. She has a history of multiple sinus infections during the fall season. She has thick drainage, decreased smell and taste, sinus pressure in cheeks and headaches.  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  Lastly, the patient has a history of skin reactions, which have been managed with various lotions and creams. She expresses a desire to avoid reliance on steroid creams. Her hands have been flared up, and while the steroids work, she would like a good moisturizer.    All medications reviewed by clinical staff and updated in chart. No new pertinent medical or surgical history except as noted in HPI.  ROS: All others negative except as noted per HPI.   Objective:  BP 104/62   Pulse 85   Temp 98.1 F (36.7 C) (Temporal)   Resp 20   Ht 5\' 3"  (1.6 m)   Wt 110 lb (49.9 kg)   SpO2 99%   BMI 19.49 kg/m  Body mass index is 19.49 kg/m. Physical Exam: General Appearance:  Alert, cooperative, no distress, appears stated age  Head:  Normocephalic, without obvious abnormality, atraumatic  Eyes:  Conjunctiva clear, EOM's intact  Ears Effusion in bilateral TMs and EACs normal bilaterally  Nose: Nares normal, hypertrophic turbinates, normal mucosa, and no visible anterior polyps  Throat: Lips, tongue normal; teeth and gums normal, normal posterior oropharynx and + cobblestoning  Neck: Supple, symmetrical  Lungs:   clear to auscultation bilaterally, Respirations unlabored, no coughing  Heart:  regular rate and rhythm and no murmur,  Appears well perfused  Extremities: No edema  Skin: erythematous, dry patches scattered on bilateral lateral hands on digits  Neurologic: No gross deficits   Labs:  Lab Orders         Allergen Fire Ant      Spirometry:  Tracings reviewed. Her effort: Good reproducible efforts. FVC: 2.45L FEV1: 1.61L, 53% predicted FEV1/FVC ratio: 0.66 Interpretation: Spirometry consistent with mixed obstructive and restrictive disease.  Please see scanned spirometry results for details.  Assessment/Plan   Acute maxillary sinusitis, recurrent - 40 mg IM Depo in clinic, 20 mg prednisone tablets start tomorrow x 4 days - Start Augmentin 875 mg twice daily for 7 days.  Take with meals.  Take with probiotics or live cultured yogurt's. Maximizing allergy and asthma/COPD therapies as below for future prevention.  Food allergy vs adverse reaction/irritant reaction to capsaicin-not at goal Avoid spicy peppers Tongue swelling x 2 with different spicy peppers. - for SKIN only reaction, okay to take Benadryl 2 capsules every 4 hours - for SKIN + ANY additional symptoms, OR IF concern for LIFE THREATENING reaction = Epipen Autoinjector EpiPen 0.3 mg. - If using Epinephrine autoinjector, call 911 - A food allergy action plan has been provided and discussed. - Medic Alert identification is recommended.  Seasonal and perennial allergic rhinitis: not at goal - allergy testing 04/2023 was positive to box elder  tree pollen, dust mites and cat - allergen avoidance as below - Continue Zyrtec (cetirizine) 10 mg  daily as needed. - Start Singulair (motenlukast) 10 mg nightly-stop if nightmares or behavior changes. - Consider nasal saline rinses as needed to help remove pollens, mucus and hydrate nasal mucosa - Continue Flonase (fluticasone) 1 spray in each nostril TWICE daily  Best results if used daily.  Discontinue if recurrent nose bleeds. - consider allergy shots as long term control of your symptoms by  teaching your immune system to be more tolerant of your allergy triggers  Allergic Conjunctivitis: not at goal - Start cromolyn eye drops-1 drop each eye 4 times daily as needed  Asthma/COPD: not at goal - your lung testing today mixed obstruction and restriction Start: Start Symbicort 160 mcg 2 puffs twice daily + Spiriva 2 puffs daily Singulair 10 mg nightly-if nightmares or behavior problems - Trial of Rescue Inhaler: Albuterol (Proair/Ventolin) 2 puffs . Use  every 4-6 hours as needed for chest tightness, wheezing, or coughing.   Can also use 15 minutes prior to exercise if you have symptoms with activity. - symptoms are not controlled if:  - Symptoms are occurring >2 times a week OR  - >2 times a month nighttime awakenings  - You are requiring systemic steroids (prednisone/steroid injections) more than once per year  - Your require hospitalization for your asthma.  - Please call the clinic to schedule a follow up if these symptoms arise Avoid smoke exposure Stay up-to-date with your annual flu vaccines, COVID vaccines and pneumonia vaccines when indicated. When you are ready to quit smoking, let us know-or your PCP.  Atopic Dermatitis: not at goal Daily Care For Maintenance (daily and continue even once eczema controlled) - Use hypoallergenic hydrating ointment at least twice daily.  This must be done daily for control of flares. (Great options include Vaseline, CeraVe, Aquaphor, Aveeno, Cetaphil, VaniCream, etc) - Avoid detergents, soaps or lotions with fragrances/dyes - Limit showers/baths to 5 minutes and use luke warm water instead of hot, pat dry following baths, and apply moisturizer - can use steroid/non-steroid therapy creams as detailed below up to twice weekly for prevention of flares.  For Flares:(add this to maintenance therapy if needed for flares) First apply steroid/non-steroid treatment creams. Wait 5 minutes then apply moisturizer.  - Triamcinolone 0.1% to body for  moderate flares-apply topically twice daily to red, raised areas of skin, followed by moisturizer. Do NOT use on face, groin or armpits. - Hydrocortisone 2.5% to face/body-apply topically twice daily to red, raised areas of skin, followed by moisturizer  Fire Ant Allergy: new problem, chronic - labs today  - if positive, will recommend allergy injections toward fire ants - if bit and have reaction of hives only-okay to take benadryl 50 mg every 4-6 hours - if bit and have reaction of hives + any other symptom (nausea, vomiting, lightheadedness, tongue swelling, difficulty breathing, etc..) use epipen. Call 911.  Consider patch testing if continues having rashes with different soaps or detergents. Use only fragrance and dye free products.   Follow up : 3 months, sooner if needed It was a pleasure meeting you in clinic today! Thank you for allowing me to participate in your care.  Tonny Bollman, MD [facility]   Other: samples provided of: spiriva, vanicream, sinus rinse bottles  Tonny Bollman, MD  [facility]

## 2023-07-26 ENCOUNTER — Encounter: Payer: Self-pay | Admitting: Internal Medicine

## 2023-07-27 ENCOUNTER — Other Ambulatory Visit (HOSPITAL_COMMUNITY): Payer: Self-pay

## 2023-07-27 NOTE — Telephone Encounter (Signed)
Please send order for air purifier. Thanks

## 2023-07-27 NOTE — Telephone Encounter (Signed)
Form completed and being faxed now

## 2023-07-28 LAB — ALLERGEN FIRE ANT: I070-IgE Fire Ant (Invicta): 0.45 kU/L — AB

## 2023-08-12 ENCOUNTER — Encounter: Payer: Self-pay | Admitting: Internal Medicine

## 2023-09-04 ENCOUNTER — Encounter: Payer: Self-pay | Admitting: Internal Medicine

## 2023-09-04 ENCOUNTER — Other Ambulatory Visit: Payer: Self-pay

## 2023-09-04 MED ORDER — VICKS AIR PURIFIER/HEPA (DEVICE) MISC: 1 refills | Status: AC

## 2023-09-04 NOTE — Telephone Encounter (Signed)
 I have printed out rx for humidifier and waiting on providers signature before faxing it

## 2023-10-28 ENCOUNTER — Encounter: Payer: Self-pay | Admitting: Internal Medicine

## 2023-10-28 ENCOUNTER — Ambulatory Visit: Payer: Medicaid Other | Admitting: Internal Medicine

## 2023-10-28 VITALS — BP 122/68 | HR 79 | Temp 97.7°F | Resp 18 | Wt 106.8 lb

## 2023-10-28 DIAGNOSIS — T63421D Toxic effect of venom of ants, accidental (unintentional), subsequent encounter: Secondary | ICD-10-CM

## 2023-10-28 DIAGNOSIS — L308 Other specified dermatitis: Secondary | ICD-10-CM | POA: Diagnosis not present

## 2023-10-28 DIAGNOSIS — T63421A Toxic effect of venom of ants, accidental (unintentional), initial encounter: Secondary | ICD-10-CM | POA: Insufficient documentation

## 2023-10-28 DIAGNOSIS — L2389 Allergic contact dermatitis due to other agents: Secondary | ICD-10-CM | POA: Insufficient documentation

## 2023-10-28 DIAGNOSIS — J3089 Other allergic rhinitis: Secondary | ICD-10-CM

## 2023-10-28 DIAGNOSIS — J4489 Other specified chronic obstructive pulmonary disease: Secondary | ICD-10-CM | POA: Diagnosis not present

## 2023-10-28 DIAGNOSIS — H1013 Acute atopic conjunctivitis, bilateral: Secondary | ICD-10-CM

## 2023-10-28 DIAGNOSIS — T7800XA Anaphylactic reaction due to unspecified food, initial encounter: Secondary | ICD-10-CM

## 2023-10-28 DIAGNOSIS — J302 Other seasonal allergic rhinitis: Secondary | ICD-10-CM

## 2023-10-28 MED ORDER — ALBUTEROL SULFATE HFA 108 (90 BASE) MCG/ACT IN AERS: 2.0000 | INHALATION_SPRAY | Freq: Four times a day (QID) | RESPIRATORY_TRACT | 2 refills | Status: AC | PRN

## 2023-10-28 MED ORDER — OLOPATADINE HCL 0.2 % OP SOLN: 1.0000 [drp] | Freq: Every day | OPHTHALMIC | 5 refills | Status: AC | PRN

## 2023-10-28 MED ORDER — CETIRIZINE HCL 10 MG PO TABS: 10.0000 mg | ORAL_TABLET | Freq: Every day | ORAL | 5 refills | Status: AC

## 2023-10-28 MED ORDER — SPIRIVA RESPIMAT 1.25 MCG/ACT IN AERS: 2.0000 | INHALATION_SPRAY | Freq: Every day | RESPIRATORY_TRACT | 5 refills | Status: AC

## 2023-10-28 MED ORDER — BUDESONIDE-FORMOTEROL FUMARATE 160-4.5 MCG/ACT IN AERO: 2.0000 | INHALATION_SPRAY | Freq: Two times a day (BID) | RESPIRATORY_TRACT | 5 refills | Status: AC

## 2023-10-28 MED ORDER — FLUTICASONE PROPIONATE 50 MCG/ACT NA SUSP: 1.0000 | Freq: Every day | NASAL | 5 refills | Status: AC

## 2023-10-28 MED ORDER — CLOBETASOL PROPIONATE 0.05 % EX OINT: TOPICAL_OINTMENT | CUTANEOUS | 1 refills | Status: AC

## 2023-10-28 NOTE — Patient Instructions (Addendum)
 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 - for SKIN only reaction, okay to take Benadryl 2 capsules every 4 hours - for SKIN + ANY additional symptoms, OR IF concern for LIFE THREATENING reaction = Epipen Autoinjector EpiPen 0.3 mg. - If using Epinephrine autoinjector, call 911 - A food allergy action plan has been provided and discussed. - Medic Alert identification is recommended.  Seasonal and perennial allergic rhinitis: - allergy testing 04/2023 was positive to box elder tree pollen, dust mites and cat - allergen avoidance as below - Continue Zyrtec (cetirizine)  10 mg   daily as needed. - Consider nasal saline rinses as needed to help remove pollens, mucus and hydrate nasal mucosa - Continue Flonase (fluticasone) 1 spray in each nostril TWICE daily  Best results if used daily.  Discontinue if recurrent nose bleeds. - consider allergy shots as long term control of your symptoms by teaching your immune system to be more tolerant of your allergy triggers  Allergic Conjunctivitis:  - Consider pataday eye drops-1 drop daily as needed.  Asthma/COPD: - your lung testing today showed moderate obstruction. Start: Start Symbicort 160 mcg 2 puffs daily. Please use at least once daily Discontinue montelukast-did not tolerate - Trial of Rescue Inhaler: Albuterol (Proair/Ventolin) 2 puffs . Use  every 4-6 hours as needed for chest tightness, wheezing, or coughing.   - During respiratory illness/asthma flare: add albuterol via nebulizer twice daily. Following albuterol use your Symbicort 160 2 puffs twice a day and add the spiriva 2 puffs daily for 1-2 weeks.  Can also use 15 minutes prior to exercise if you have symptoms with activity. - symptoms are not controlled if:  - Symptoms are occurring >2 times a week OR  - >2 times a month nighttime awakenings  - You are requiring systemic steroids  (prednisone/steroid injections) more than once per year  - Your require hospitalization for your asthma.  - Please call the clinic to schedule a follow up if these symptoms arise Avoid smoke exposure Stay up-to-date with your annual flu vaccines, COVID vaccines and pneumonia vaccines when indicated. When you are ready to quit smoking, let us know-or your PCP.  Atopic Dermatitis and possible contact dermatitis. Daily Care For Maintenance (daily and continue even once eczema controlled) - Use hypoallergenic hydrating ointment at least twice daily.  This must be done daily for control of flares. (Great options include Vaseline, CeraVe, Aquaphor, Aveeno, Cetaphil, VaniCream, etc) - Avoid detergents, soaps or lotions with fragrances/dyes - Limit showers/baths to 5 minutes and use luke warm water instead of hot, pat dry following baths, and apply moisturizer - can use steroid/non-steroid therapy creams as detailed below up to twice weekly for prevention of flares.  For Flares:(add this to maintenance therapy if needed for flares) First apply steroid/non-steroid treatment creams. Wait 5 minutes then apply moisturizer.  - Triamcinolone 0.1% to body for moderate flares-apply topically twice daily to red, raised areas of skin, followed by moisturizer. Do NOT use on face, groin or armpits. - Hydrocortisone 2.5% to face/body-apply topically twice daily to red, raised areas of skin, followed by moisturizer -clobetasol 0.05% twice a day on hands on red irritated areas until flares resolve.  [facility] [employer] [facility]:  - labs 07/04/23: positive to fire ant - recommend allergy injections toward fire ants - if bit and have reaction of hives only-okay to take benadryl 50 mg every 4-6 hours - if bit and have reaction of  hives + any other symptom (nausea, vomiting, lightheadedness, tongue swelling, difficulty breathing, etc..) use epipen. Call 911.   Follow up : 6 months, sooner for patch testing if needed. It was a  pleasure seeing you again in clinic today! Thank you for allowing me to participate in your care.  Tonny Bollman, MD [facility]

## 2023-10-28 NOTE — Progress Notes (Signed)
 FOLLOW UP Date of Service/Encounter:  10/28/23  Subjective:  Sandra Gilbert (DOB: Jan 14, 1986) is a 38 y.o. female who [facility] in re-evaluation of the following: asthma/copd, food allergy, eczema, ant bite reaction  History obtained from: chart review and patient.  For Review, LV was on 07/24/23  with Dr.Jenasia Dolinar seen for routine follow-up. See below for summary of history and diagnostics.   Therapeutic plans/changes recommended: Asthma COPD still was not under control so we started Symbicort 160, added Singulair.  She was also reporting symptoms consistent with acute sinusitis.  We treated her with Augmentin and steroid burst. ----------------------------------------------------- Pertinent History/Diagnostics:  Asthma/COPD  did have childhood asthma, but has no current inhalers.  Last used an inhaler after running after a dog and couldn't breath but this was many years ago. Not getting systemic steroids due to breathing issues She does get SOB regularly. She is seeing a cardiologist.  Multiple times per day will feel SOB, heat and exercise trigger symptoms. chronic smoker. Smokes 1/2 pack a day of mentholated cigarettes  - Nonobstructive ratio, low FEV1, possible restriction.  With significant improvement in FEV1 of 12% following post bronchodilator  spirometry 04/23/23: R 0.72 pre 0.78 post, FEV1 60% pre, 67% post; FVC 2.49 L pre, 2.59 L post Allergic Rhinitis:  When teenager, she had an allergy testing and was told she was allergic to many things.  She is not on allergy meds. Rash from grass. Described as itchy. Gets watery nose, watery, itchy eyes. Gets frequent sinus infections in the fall.  - SPT environmental panel (04/2023): negative to green peppers  Adverse Food Reaction:  Ate a Mediterranean salad with pepper from her garden a few months ago. Pepper was extremely spicy.  She drank milk to help with the extreme burning sensation.  Felt she swallowed a cactus, 6 hours later went to UC and had rash and trouble breathing. Her husband who was with her on the phone noticed her voice sounded different, she was wheezing. She felt her skin was on fire like she was bitten by ants. Tx; steroid shot and benadryl. Where she touched the pepper her skin was also severely red and irritated. Rest of that week she had issue eating hotdog with paprika and felt scratchiness in the back of her throat. Immediately gave herself benadryl. Has been avoiding pepper since. - SPT select foods (04/2023): positive to box elder tree pollen, dust mites and cat  - 04/23/23: negative to chili pepper, paprika, normal tryptase 6.4,  Eczema:  rash from different detergents and soaps.  She does have rash on her hands mostly, in winter will get it on her elbows thighs and sometimes tops of feet Localized reaction from fire ants : Fire ants-stepped in a nest and entire legs became swollen. She does get blisters from ant bites.  Other: ADHD, PTSD, bipolar II, anxiety, PSVT  --------------------------------------------------- Today presents for follow-up. Discussed the use of AI scribe software for clinical note transcription with the patient, who gave verbal consent to proceed.  History of Present Illness   The patient presents with skin breakouts and respiratory issues.  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 Their hands are very dry, and they frequently wash their hands. They use Vanicream moisturizer on their face three to four times a day due to dryness. They  have tried various skincare products, including Dove body scrub and coconut oil, but still experience breakouts, particularly after showering.  Currently has a breakout on her upper abdomen as of this morning.  They have a history of  respiratory issues but have not used an inhaler in two weeks. They previously used a nebulizer prescribed by a pulmonologist, which helped clear their symptoms, but have not used it recently. They are not experiencing shortness of breath or cough, and they can take a full, deep breath without pain. They stopped taking montelukast due to vivid dreams and have not been using Spiriva or Symbicort regularly.  She is not interested in using these medications on a regular basis.  They have a history of sinus infections and were prescribed an antibiotic, which they stopped after three days due to severe side effects, including vomiting and diarrhea at the last visit. They continue to experience ear issues, uncertain if it is due to fluid or wax buildup, and uses Flonase and Zyrtec, although they do not use Zyrtec regularly. They dislike the eye drops previously prescribed due to a burning sensation.  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 They have not had a cigarette since December and have been vaping instead.  She is not ready to quit vaping.     All medications reviewed by clinical staff and updated in chart. No new pertinent medical or surgical history except as noted in HPI.  ROS: All others negative except as noted per HPI.   Objective:  BP 122/68   Pulse 79   Temp 97.7 F (36.5 C) (Temporal)   Resp 18   Wt 106 lb 12.8 oz (48.4 kg)   SpO2 100%   BMI 18.92 kg/m  Body mass index is 18.92 kg/m. Physical Exam: General Appearance:  Alert, cooperative, no distress, appears stated age, shaky  Head:  Normocephalic, without obvious abnormality, atraumatic  Eyes:  Conjunctiva clear, EOM's intact  Ears EACs normal bilaterally and normal TMs bilaterally  Nose: Nares normal, normal mucosa and no visible anterior polyps  Throat: Lips, tongue normal; teeth and gums normal, normal posterior oropharynx  Neck: Supple, symmetrical  Lungs:   clear  to auscultation bilaterally, Respirations unlabored, no coughing  Heart:  regular rate and rhythm and no murmur, Appears well perfused  Extremities: No edema  Skin: erythematous, dry patches scattered on upper abdomen on left, bilateral hands dry and cracked  Neurologic: No gross deficits   Labs:  Lab Orders  No laboratory test(s) ordered today    Spirometry:  Tracings reviewed. Her effort: Good reproducible efforts. FVC: 2.92L FEV1: 1.99L, 66% predicted FEV1/FVC ratio: 0.68 Interpretation: Spirometry consistent with moderate obstructive disease.  Please see scanned spirometry results for details.   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 - for SKIN only reaction, okay to take Benadryl 2 capsules every 4 hours - for SKIN + ANY additional symptoms, OR IF concern for LIFE THREATENING reaction = Epipen Autoinjector EpiPen 0.3 mg. - If using Epinephrine autoinjector, call 911 - A food allergy action plan has been provided and discussed. - Medic Alert identification is recommended.  Seasonal and perennial allergic rhinitis: at goal, ears look okay.  - allergy testing 04/2023 was positive to box elder tree pollen, dust mites and cat - allergen avoidance as below - Continue Zyrtec (cetirizine) 10 mg  daily as needed. -  Consider nasal saline rinses as needed to help remove pollens, mucus and hydrate nasal mucosa - Continue Flonase (fluticasone) 1 spray in each nostril TWICE daily  Best results if used daily.  Discontinue if recurrent nose bleeds. - consider allergy shots as long term control of your symptoms by teaching your immune system to be more tolerant of your allergy triggers  Allergic Conjunctivitis: stable, switching to pataday from cromolyn as did not tolerate. - Consider pataday eye drops-1 drop daily as needed.  Asthma/COPD: - your lung testing today  moderate obstruction Start: Start Symbicort 160 mcg 2 puffs daily Discontinue montelukast-did not tolerate - Trial of Rescue Inhaler: Albuterol (Proair/Ventolin) 2 puffs . Use  every 4-6 hours as needed for chest tightness, wheezing, or coughing.   - During respiratory illness/asthma flare: add albuterol via nebulizer twice daily. Following albuterol use your Symbicort 160 2 puffs twice a day and add the spiriva 2 puffs daily for 1-2 weeks.  Can also use 15 minutes prior to exercise if you have symptoms with activity. - symptoms are not controlled if:  - Symptoms are occurring >2 times a week OR  - >2 times a month nighttime awakenings  - You are requiring systemic steroids (prednisone/steroid injections) more than once per year  - Your require hospitalization for your asthma.  - Please call the clinic to schedule a follow up if these symptoms arise Avoid smoke exposure Stay up-to-date with your annual flu vaccines, COVID vaccines and pneumonia vaccines when indicated. When you are ready to quit smoking, let us know-or your PCP.  Atopic Dermatitis and possible contact dermatitis. Not at goal, suspect contact dermatitis on hands and posssibly due to soaps/detergents being used. Daily Care For Maintenance (daily and continue even once eczema controlled) - Use hypoallergenic hydrating ointment at least twice daily.  This must be done daily for control of flares. (Great options include Vaseline, CeraVe, Aquaphor, Aveeno, Cetaphil, VaniCream, etc) - Avoid detergents, soaps or lotions with fragrances/dyes - Limit showers/baths to 5 minutes and use luke warm water instead of hot, pat dry following baths, and apply moisturizer - can use steroid/non-steroid therapy creams as detailed below up to twice weekly for prevention of flares.  For Flares:(add this to maintenance therapy if needed for flares) First apply steroid/non-steroid treatment creams. Wait 5 minutes then apply moisturizer.  -  Triamcinolone 0.1% to body for moderate flares-apply topically twice daily to red, raised areas of skin, followed by moisturizer. Do NOT use on face, groin or armpits. - Hydrocortisone 2.5% to face/body-apply topically twice daily to red, raised areas of skin, followed by moisturizer -clobetasol 0.05% twice a day on hands on red irritated areas until flares resolve.  [employer] [facility]ergy: interested in starting AIT - labs 07/04/23: positive to fire ant - recommend allergy injections toward fire ants - if bit and have reaction of hives only-okay to take benadryl 50 mg every 4-6 hours - if bit and have reaction of hives + any other symptom (nausea, vomiting, lightheadedness, tongue swelling, difficulty breathing, etc..) use epipen. Call 911. -consent signed to start fire ant injections.   Follow up : 6 months, sooner for patch testing if needed. Discussed must be off topical steroids for 2 weeks prior to patch testing and systemic steroids for 4 weeks prior to past testing explained that patch testing will occur Monday Wednesday Friday.  Discussed need to avoid showers, water and excessive sweating during patch testing week. It was a pleasure seeing you again in clinic today! Thank you  for allowing me to participate in your care.  Other: none  Tonny Bollman, MD  [facility]

## 2023-11-02 ENCOUNTER — Ambulatory Visit (INDEPENDENT_AMBULATORY_CARE_PROVIDER_SITE_OTHER): Payer: Medicaid Other | Admitting: Internal Medicine

## 2023-11-02 DIAGNOSIS — L2389 Allergic contact dermatitis due to other agents: Secondary | ICD-10-CM

## 2023-11-02 NOTE — Progress Notes (Signed)
    Follow-up Note  RE: Sandra Gilbert MRN: [id number] DOB: 11-28-1985 Date of Office Visit: 11/02/2023  Primary care provider: Sherlie Ban, NP Referring provider: Sherlie Ban, NP   Sandra Gilbert returns to the office today for the patch test placement, given suspected history of contact dermatitis.    Diagnostics: True Test patches and selected products placed.    Plan:   Allergic contact dermatitis - Instructions provided on care of the patches for the next 48 hours. Sandra Gilbert was instructed to avoid showering for the next 48 hours. Sandra Gilbert will follow up in 48 hours and 96 hours for patch readings.    Sandra Luz, MD [facility]

## 2023-11-03 NOTE — Progress Notes (Signed)
    Follow-up Note  RE: Sandra Gilbert MRN: [id number] DOB: September 21, 1985 Date of Office Visit: 11/06/2023  Primary care provider: Sherlie Ban, NP Referring provider: Sherlie Ban, NP   Edna returns to the office today for the final patch test interpretation, given suspected history of contact dermatitis.  Patient reports itching originating from several different areas on her back where patch testing had been placed.  She is not currently taking an antihistamine or using medicated topical treatments on these areas.   Diagnostics:  TRUE TEST 96-hour hour reading:  positive reaction to #1 (Nickel Sulfate), positive reaction to #2 (Wool Alcohols), positive reaction to #12 (Cobalt chloride), possible reaction to #14 (Epoxy resin), and possible reaction to #16 (Black rubber mix), possible reaction to gold thiosulfate, positive to [employer], and positive to [employer] Scrub.   Cosmetics brought in by patient that were negative include- Equate Triple Repair, Brightest Bloom BBW Bodywash, Dove Body scrub, and Frosted Coconut Snowball.   Plan: Allergic contact dermatitis - The patient has been provided detailed information regarding the substances she is sensitive to, as well as products containing the substances.   - Meticulous avoidance of these substances is recommended.  - If avoidance is not possible, the use of barrier creams or lotions is recommended. - If symptoms persist or progress despite meticulous avoidance of the substances as listed above, a dermatology referral may be warranted. - You may take an antihistamine once a day if needed for itch. You may take an additional dose of antihistamine once a day if needed for breakthrough symptoms if needed. Some examples of over the counter antihistamines include Zyrtec (cetirizine), Xyzal (levocetirizine), Allegra (fexofenadine), and Claritin (loratidine).  - Begin triamcinolone cream to red and itchy areas under  your face up to twice a day if needed. Do not use this medication longer than 2 weeks in a row.  - An email has been sent to the address provided containing a safe list of products. Do not use Olay Fresh Outlast or Cl[employer] Scrub even if they appear on the safe list.   Call the clinic if this treatment plan is not working well for you  Follow up in 3 months or sooner if needed.  Thank you for the opportunity to care for this patient.  Please do not hesitate to contact me with questions.  Thermon Leyland, FNP [facility]

## 2023-11-04 ENCOUNTER — Ambulatory Visit (INDEPENDENT_AMBULATORY_CARE_PROVIDER_SITE_OTHER): Payer: Medicaid Other | Admitting: Internal Medicine

## 2023-11-04 DIAGNOSIS — L2389 Allergic contact dermatitis due to other agents: Secondary | ICD-10-CM

## 2023-11-04 NOTE — Progress Notes (Signed)
 Follow Up Note  RE: Sandra Gilbert MRN: [id number] DOB: 1986/09/01 Date of Office Visit: 11/04/2023  Referring provider: Sherlie Ban, NP Primary care provider: Sherlie Ban, NP  History of Present Illness: I had the pleasure of seeing Sandra Gilbert for a follow up visit at the [facility] on 11/04/2023. She is a 38 y.o. female, who is being followed for contact dermatitis . Today she is here for initial patch test interpretation, given suspected history of contact dermatitis.   Diagnostics:  TRUE TEST 48 hour reading:   T.R.U.E. Test - 11/04/23 1000       Test Information   Time Antigen Placed 1113    Manufacturer Greer    Location Back    Number of Test 36    Reading Interval Day 1;Day 3;Day 5    Panel Panel 1;Panel 2;Panel 3      Panel 1   1. Nickel Sulfate 2    2. Wool Alcohols 2    3. Neomycin Sulfate 0    4. Potassium Dichromate 0    5. Caine Mix 0    6. Fragrance Mix 0    7. Colophony 0    8. Paraben Mix 0    9. Negative Control 0    10. Balsam of [place] 0    11. Ethylenediamine Dihydrochloride 0    12. Cobalt Dichloride --   +/-     Panel 2   13. p-tert Butylphenol Formaldehyde Resin 0    14. Epoxy Resin 0    15. Carba Mix 0    16.  Black Rubber Mix 0    17. Cl+ Me-Isothiazolinone 0    18. Quaternium-15 0    19. Methyldibromo Glutaronitrile 0    20. p-Phenylenediamine 0    21. Formaldehyde 0    22. Mercapto Mix 0    23. Thimerosal 0    24. Thiuram Mix 0      Panel 3   25. Diazolidinyl Urea 0    26. Quinoline Mix 0    27. Tixocortol-21-Pivalate 0    28. Gold Sodium Thiosulfate --   +/-   29. Imidazolidinyl Urea 0    30. Budesonide 0    31. Hydrocortisone-17-Butyrate 0    32. Mercaptobenzothiazole 0    33. Bacitracin 0    34. Parthenolide 0    35. Disperse Blue 106 0    36. 2-Bromo-2-Nitropropane-1,3-diol 0             Metals Patch - 11/04/23 1000     Time Antigen Placed 1113    Manufacturer Greer    Location Back     Number of Test 6    Reading Interval Day 1;Day 3;Day 5    Select Select    Aluminum Hydroxide 10% 0    Chromium chloride 1% 0    Cobalt chloride hexahydrate 1% 2    Molybdenum chloride 0.5% 0    Nickel sulfate hexahydrate 5% 0    Potassium dichromate 0.25% 1              Assessment and Plan: Sandra Gilbert is a 38 y.o. female with: Concern for Contact Dermatitis:  The patient has been provided detailed information regarding the substances she is sensitive to, as well as products containing the substances.  Meticulous avoidance of these substances is recommended. If avoidance is not possible, the use of barrier creams or lotions is recommended. If symptoms persist or progress despite meticulous  avoidance of chemicals/substances above, dermatology evaluation may be warranted. No follow-ups on file.  It was my pleasure to see Sandra Gilbert today and participate in her care. Please feel free to contact me with any questions or concerns.  Sincerely,   Ferol Luz, MD [facility]

## 2023-11-06 ENCOUNTER — Ambulatory Visit: Payer: Medicaid Other | Admitting: Family Medicine

## 2023-11-06 ENCOUNTER — Encounter: Payer: Self-pay | Admitting: Family Medicine

## 2023-11-06 DIAGNOSIS — L2389 Allergic contact dermatitis due to other agents: Secondary | ICD-10-CM | POA: Diagnosis not present

## 2023-11-06 NOTE — Patient Instructions (Addendum)
 Diagnostics:  TRUE TEST 96-hour hour reading: positive reaction to #1 (Nickel Sulfate), positive reaction to #2 (Wool Alcohols), positive reaction to #12 (Cobalt chloride), possible reaction to #14 (Epoxy resin), and possible reaction to #16 (Black rubber mix), possible reaction to gold thiosulfate, positive to [employer], and positive to [employer] Scrub.   Cosmetics brought in by patient that were negative include- Equate Triple Repair, Brightest Bloom BBW Bodywash, Dove Body scrub, and Frosted Coconut Snowball.   Plan: Allergic contact dermatitis - The patient has been provided detailed information regarding the substances she is sensitive to, as well as products containing the substances.   - Meticulous avoidance of these substances is recommended.  - If avoidance is not possible, the use of barrier creams or lotions is recommended. - If symptoms persist or progress despite meticulous avoidance of the substances as listed above, a dermatology referral may be warranted. - You may take an antihistamine once a day if needed for itch. You may take an additional dose of antihistamine once a day if needed for breakthrough symptoms if needed. Some examples of over the counter antihistamines include Zyrtec (cetirizine), Xyzal (levocetirizine), Allegra (fexofenadine), and Claritin (loratidine).  - Begin triamcinolone cream to red and itchy areas under your face up to twice a day if needed. Do not use this medication longer than 2 weeks in a row.  - An email has been sent to the address provided containing a safe list of products. Do not use Olay Fresh Outlast or [employer] Scrub even if they appear on the safe list.   Call the clinic if this treatment plan is not working well for you  Follow up in 3 months or sooner if needed.

## 2024-02-10 ENCOUNTER — Ambulatory Visit: Admitting: Internal Medicine

## 2024-05-11 ENCOUNTER — Other Ambulatory Visit: Payer: Self-pay | Admitting: Internal Medicine

## 2024-06-11 ENCOUNTER — Other Ambulatory Visit: Payer: Self-pay | Admitting: Medical Genetics

## 2024-06-11 DIAGNOSIS — Z006 Encounter for examination for normal comparison and control in clinical research program: Secondary | ICD-10-CM

## 2024-08-03 LAB — GENECONNECT MOLECULAR SCREEN: Genetic Analysis Overall Interpretation: NEGATIVE

## 2024-08-19 ENCOUNTER — Ambulatory Visit: Admitting: Diagnostic Neuroimaging

## 2024-08-19 ENCOUNTER — Encounter: Payer: Self-pay | Admitting: Diagnostic Neuroimaging

## 2024-08-19 VITALS — BP 101/68 | HR 63 | Ht 64.0 in | Wt 107.6 lb

## 2024-08-19 DIAGNOSIS — R29898 Other symptoms and signs involving the musculoskeletal system: Secondary | ICD-10-CM

## 2024-08-19 NOTE — Progress Notes (Signed)
 "  [facility]  PATIENT: Sandra Gilbert DOB: 11/10/1985  REFERRING CLINICIAN: Candance, Jeoffrey SAILOR, PA-C HISTORY FROM: patient  REASON FOR VISIT: new consult   HISTORICAL  CHIEF COMPLAINT:  Chief Complaint  Patient presents with   New Patient (Initial Visit)    Rm 7, husband.  Symptoms going on for a year. R foot drop, leg swelling, RLS, fine motor r hand decreased, r numbness/pain.     HISTORY OF PRESENT ILLNESS:   38 year old female here for evaluation of right arm and right leg weakness and numbness.  Symptoms started about a year or 2 ago with onset of numbness and incoordination and difficulty of the right leg.  She describes some swelling in the leg as well as dull and aching pain rating down the leg.  She describes restlessness of the right leg as well.  Noted some numbness and coronation of the right arm.  Has been having some issues related to recurrent syncope and tachycardia issues, followed by cardiology.  Of note around the onset of symptoms she had an incident where she ate a pepper that she grew in the garden, and had some type of allergic reaction.  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  Fortunately symptoms have gradually improved recently and weight is improving as well.  Patient went to orthopedic clinic and had MRI of the cervical and lumbar spine demonstrating some mild degenerative changes but no surgically treatable issues; also had EMG nerve conduction study testing which was normal.   REVIEW OF SYSTEMS: Full 14 system review of systems performed and negative with exception of: as per HPI.  ALLERGIES: Allergies[1]  HOME MEDICATIONS: Outpatient Medications Prior to Visit  Medication Sig Dispense Refill   Air Cleaners (VICKS AIR PURIFIER/HEPA) (Device) MISC Use daily 1 each 1   cetirizine  (ZYRTEC  ALLERGY ) 10 MG tablet Take 1  tablet (10 mg total) by mouth daily. (Patient taking differently: Take 10 mg by mouth daily. Taking as needed seasonal allergies) 32 tablet 5   clobetasol  ointment (TEMOVATE ) 0.05 % Apply topically twice daily to BODY as needed for SEVERE red, sandpaper and thickened like rash.  Do not use on face, groin or armpits.  Use for up to two weeks at a time. 60 g 1   diphenhydrAMINE (BENADRYL) 12.5 MG/5ML elixir Take 10-20 mLs by mouth 4 (four) times daily as needed for itching or allergies.     EPINEPHrine  (EPIPEN  2-PAK) 0.3 mg/0.3 mL IJ SOAJ injection Inject 0.3 mg into the muscle as needed for anaphylaxis. 2 each 2   fluticasone  (FLONASE ) 50 MCG/ACT nasal spray Place 1 spray into both nostrils daily. 16 g 5   hydrocortisone  2.5 % ointment Apply topically twice daily as need to red sandpapery rash. 30 g 2   Levonorgestrel (MIRENA, 52 MG, IU) 1 each by Intrauterine route daily.     Olopatadine  HCl (PATADAY ) 0.2 % SOLN Place 1 drop into both eyes daily as needed. 2.5 mL 5   Specialty Vitamins Products (MEMORY COMPLEX BRAIN HEALTH PO) Take 2 tablets by mouth daily.     triamcinolone  ointment (KENALOG ) 0.1 % APPLY TOPICALLY TWICE DAILY TO BODY AS NEEDED FOR RED, SANDPAPER LIKE RASH. DO NOT USE ON FACE, GROIN OR ARMPITS. 80 g 1   albuterol  (VENTOLIN  HFA) 108 (90 Base) MCG/ACT inhaler Inhale 2 puffs into the lungs every 6 (six) hours as needed  for wheezing or shortness of breath. (Patient not taking: Reported on 08/19/2024) 8 g 2   budesonide -formoterol  (SYMBICORT ) 160-4.5 MCG/ACT inhaler Inhale 2 puffs into the lungs in the morning and at bedtime. (Patient not taking: Reported on 08/19/2024) 1 each 5   hydrOXYzine (ATARAX) 25 MG tablet Take 25 mg by mouth 2 (two) times daily. (Patient not taking: Reported on 08/19/2024)     omeprazole  (PRILOSEC) 40 MG capsule Take 40 mg by mouth daily. (Patient not taking: Reported on 08/19/2024)     sertraline (ZOLOFT) 100 MG tablet Take 100 mg by mouth daily. (Patient not  taking: Reported on 08/19/2024)     Tiotropium Bromide Monohydrate  (SPIRIVA  RESPIMAT) 1.25 MCG/ACT AERS Inhale 2 puffs into the lungs daily. (Patient not taking: Reported on 08/19/2024) 1 each 5   No facility-administered medications prior to visit.    PAST MEDICAL HISTORY: Past Medical History:  Diagnosis Date   Abnormal electrocardiogram    Anxiety 2014   PTSD   Asthma    Bipolar II disorder (HCC) 12/24/2020   Dental cavities    very poor dental hygeine   Depression    Hypotension    Missed abortion 11/03/2014   Palpitations    tachycardia   Shortness of breath    Smoker 12/02/2018   Vaginal delivery 2006, 2011    PAST SURGICAL HISTORY: Past Surgical History:  Procedure Laterality Date   CESAREAN SECTION  2010   DILATION AND EVACUATION N/A 11/03/2014   Procedure: DILATATION AND EVACUATION;  Surgeon: Lynwood KANDICE Solomons, MD;  Location: [facility];  Service: Gynecology;  Laterality: N/A;    FAMILY HISTORY: Family History  Problem Relation Age of Onset   Diabetes Mother    Heart disease Mother    Muscular dystrophy Father    Heart attack Sister    Heart attack Maternal Grandmother     SOCIAL HISTORY: Social History   Socioeconomic History   Marital status: Married    Spouse name: Camellia   Number of children: 4   Years of education: Not on file   Highest education level: Not on file  Occupational History   Not on file  Tobacco Use   Smoking status: Former    Current packs/day: 1.00    Average packs/day: 1 pack/day for 15.0 years (15.0 ttl pk-yrs)    Types: Cigarettes   Smokeless tobacco: Never  Vaping Use   Vaping status: Every Day  Substance and Sexual Activity   Alcohol use: No   Drug use: No   Sexual activity: Not Currently    Birth control/protection: None  Other Topics Concern   Not on file  Social History Narrative   Caffiene 2 cups daily   Live husband 3 kids.    Working:  custodian   Social Drivers of Health   Tobacco Use: Medium Risk (08/19/2024)    Patient History    Smoking Tobacco Use: Former    Smokeless Tobacco Use: Never    Passive Exposure: Not on Actuary Strain: High Risk (08/14/2023)   Received from [facility]   Overall Financial Resource Strain (CARDIA)    Difficulty of Paying Living Expenses: Very hard  Food Insecurity: Food Insecurity Present (08/14/2023)   Received from [facility]   Epic    Within the past 12 months, you worried that your food would run out before you got the money to buy more.: Often true    Within the past 12 months, the food you  bought just didn't last and you didn't have money to get more.: Often true  Transportation Needs: Unmet Transportation Needs (08/14/2023)   Received from [facility] - Transportation    In the past 12 months, has lack of transportation kept you from medical appointments or from getting medications?: Yes    Lack of Transportation (Non-Medical): Yes  Physical Activity: Insufficiently Active (08/14/2023)   Received from [facility]   Exercise Vital Sign    On average, how many days per week do you engage in moderate to strenuous exercise (like a brisk walk)?: 7 days    On average, how many minutes do you engage in exercise at this level?: 20 min  Stress: Stress Concern Present (08/14/2023)   Received from [facility] - Occupational Stress Questionnaire    Feeling of Stress : Very much  Social Connections: Moderately Integrated (08/14/2023)   Received from [facility]   Social Connection and Isolation Panel    In a typical week, how many times do you talk on the phone with family, friends, or neighbors?: More than three times a week    How often do you get together with friends or relatives?: Never    How often do you attend church or religious services?: 1 to 4 times per year    Do you belong to  any clubs or organizations such as church groups, unions, fraternal or athletic groups, or school groups?: No    How often do you attend meetings of the clubs or organizations you belong to?: Never    Are you married, widowed, divorced, separated, never married, or living with a partner?: Married  Intimate Partner Violence: Not on file  Depression (PHQ2-9): Not on file  Alcohol Screen: Not on file  Housing: High Risk (09/06/2023)   Received from [facility]   Epic    In the last 12 months, was there a time when you were not able to pay the mortgage or rent on time?: Yes    In the past 12 months, how many times have you moved where you were living?: 0    At any time in the past 12 months, were you homeless or living in a shelter (including now)?: No  Utilities: At Risk (08/14/2023)   Received from [facility]ities    Threatened with loss of utilities: Yes  Health Literacy: Inadequate Health Literacy (08/14/2023)   Received from [facility]   B1300 Health Literacy    Frequency of need for help with medical instructions: Often     PHYSICAL EXAM  GENERAL EXAM/CONSTITUTIONAL: Vitals:  Vitals:   08/19/24 1117  BP: 101/68  Pulse: 63  Weight: 107 lb 9.6 oz (48.8 kg)  Height: 5' 4 (1.626 m)   Body mass index is 18.47 kg/m. Wt Readings from Last 3 Encounters:  08/19/24 107 lb 9.6 oz (48.8 kg)  10/28/23 106 lb 12.8 oz (48.4 kg)  07/24/23 110 lb (49.9 kg)   Patient is in no distress; well developed, nourished and groomed; neck is supple  CARDIOVASCULAR: Examination of carotid arteries is normal; no carotid bruits Regular rate and rhythm, no murmurs Examination of peripheral vascular system by observation and palpation is normal  EYES: Ophthalmoscopic exam of optic discs and posterior segments is normal; no papilledema or hemorrhages No results found.  MUSCULOSKELETAL: Gait, strength, tone, movements noted  in  Neurologic exam below  NEUROLOGIC: MENTAL STATUS:      No data to display         awake, alert, oriented to person, place and time recent and remote memory intact normal attention and concentration language fluent, comprehension intact, naming intact fund of knowledge appropriate  CRANIAL NERVE:  2nd - no papilledema on fundoscopic exam 2nd, 3rd, 4th, 6th - pupils equal and reactive to light, visual fields full to confrontation, extraocular muscles intact, no nystagmus 5th - facial sensation symmetric 7th - facial strength symmetric 8th - hearing intact 9th - palate elevates symmetrically, uvula midline 11th - shoulder shrug symmetric 12th - tongue protrusion midline  MOTOR:  normal bulk and tone, full strength in the BUE, BLE; FLUCTUATING GIVE WAY WEAKNESS IN RIGHT FOOT DORSIFLEXION  SENSORY:  normal and symmetric to light touch, temperature, vibration  COORDINATION:  finger-nose-finger, fine finger movements normal  REFLEXES:  deep tendon reflexes 1+ and symmetric  GAIT/STATION:  narrow based gait; USING RIGHT AFO     DIAGNOSTIC DATA (LABS, IMAGING, TESTING) - I reviewed patient records, labs, notes, testing and imaging myself where available.  Lab Results  Component Value Date   WBC 9.3 04/26/2023   HGB 13.0 04/26/2023   HCT 38.9 04/26/2023   MCV 93.1 04/26/2023   PLT 245 04/26/2023      Component Value Date/Time   NA 136 04/28/2023 1030   K 4.2 04/28/2023 1030   CL 105 04/28/2023 1030   CO2 24 04/28/2023 1030   GLUCOSE 83 04/28/2023 1030   GLUCOSE 77 04/26/2023 0343   BUN 6 04/28/2023 1030   CREATININE 0.60 04/28/2023 1030   CALCIUM 9.1 04/28/2023 1030   PROT 7.5 11/19/2020 1612   ALBUMIN 4.4 11/19/2020 1612   AST 16 11/19/2020 1612   ALT 14 11/19/2020 1612   ALKPHOS 72 11/19/2020 1612   BILITOT 0.8 11/19/2020 1612   GFRNONAA >60 04/26/2023 0343   No results found for: CHOL, HDL, LDLCALC, LDLDIRECT, TRIG, CHOLHDL No results  found for: HGBA1C No results found for: VITAMINB12 No results found for: TSH   03/03/2024 EMG nerve conduction study - Normal  01/12/2024 MRI cervical spine - Mild reversal of normal cervical curvature; C4-5 moderate disc osteophyte complex with mild left foraminal narrowing.  01/12/2024 MRI lumbar spine - At L5-S1 mild spinal stenosis and mild right neuroforaminal narrowing. - At L4-5 mild right neuroforaminal narrowing.    ASSESSMENT AND PLAN  38 y.o. year old female here with constellation of symptoms including right arm and right leg incoordination, weakness, numbness, speech difficulty, in the setting of weight loss related to allergic reaction to pepper, from 2023-2025.  Dx:  1. Right arm weakness   2. Right leg weakness     PLAN:  RIGHT ARM INCOORDINATION / RIGHT LEG WEAKNESS / SPEECH DIFFICULTY (since ~2023) - check MRI brain w/wo (rule out other vascular, autoimmune, inflamm)  TACHYCARDIA / SYNCOPE / SVT - per cardiology  Orders Placed This Encounter  Procedures   MR BRAIN W WO CONTRAST   Return for pending if symptoms worsen or fail to improve, pending test results.    EDUARD FABIENE HANLON, MD 08/19/2024, 12:05 PM Certified in Neurology, Neurophysiology and Neuroimaging  [facility] [address], [address] [place], [place] [address] [phone]     [1]  Allergies Allergen Reactions   Aspirin Nausea And Vomiting   Latex     Rash,itchness in genital area.   Other Hives and Swelling  CHILI PEPPERS   "

## 2024-08-19 NOTE — Patient Instructions (Signed)
" °  RIGHT ARM INCOORDINATION / RIGHT LEG WEAKNESS / SPEECH DIFFICULTY - check MRI brain w/wo (rule out other vascular, autoimmune, inflamm)  TACHYCARDIA / SYNCOPE / SVT - per cardiology "

## 2024-08-30 ENCOUNTER — Telehealth: Payer: Self-pay | Admitting: Diagnostic Neuroimaging

## 2024-08-30 NOTE — Telephone Encounter (Signed)
 [employer] shara: [id number] exp. 08/23/24-09/22/24 sent to [facility] [phone]
